# Patient Record
Sex: Female | Born: 1975 | Race: White | Hispanic: No | Marital: Married | Smoking: Never smoker
Health system: Southern US, Community
[De-identification: ages and names within clinical notes are randomized; demographics above are authoritative.]

## PROBLEM LIST (undated history)

## (undated) ENCOUNTER — Ambulatory Visit: Admission: EM | Disposition: A | Discharge status: Home / Self Care | Payer: BC Managed Care – PPO

## (undated) ENCOUNTER — Emergency Department (HOSPITAL_COMMUNITY): Payer: Self-pay | Source: Home / Self Care

## (undated) DIAGNOSIS — Z803 Family history of malignant neoplasm of breast: Secondary | ICD-10-CM

## (undated) DIAGNOSIS — K8020 Calculus of gallbladder without cholecystitis without obstruction: Secondary | ICD-10-CM

## (undated) DIAGNOSIS — R112 Nausea with vomiting, unspecified: Secondary | ICD-10-CM

## (undated) DIAGNOSIS — Z9889 Other specified postprocedural states: Secondary | ICD-10-CM

## (undated) DIAGNOSIS — K219 Gastro-esophageal reflux disease without esophagitis: Secondary | ICD-10-CM

## (undated) DIAGNOSIS — Z789 Other specified health status: Secondary | ICD-10-CM

## (undated) DIAGNOSIS — Z800 Family history of malignant neoplasm of digestive organs: Secondary | ICD-10-CM

## (undated) HISTORY — DX: Calculus of gallbladder without cholecystitis without obstruction: K80.20

## (undated) HISTORY — PX: BREAST LUMPECTOMY: SHX2

## (undated) HISTORY — DX: Family history of malignant neoplasm of digestive organs: Z80.0

## (undated) HISTORY — DX: Family history of malignant neoplasm of breast: Z80.3

## (undated) HISTORY — PX: DENTAL SURGERY: SHX609

## (undated) HISTORY — PX: DILATION AND CURETTAGE OF UTERUS: SHX78

---
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
## 2005-11-06 ENCOUNTER — Ambulatory Visit: Payer: Self-pay

## 2005-11-06 IMAGING — US ULTRASOUND LEFT BREAST
1 series · 8 of 8 positions shown · non-contrast
Comparison: none

REASON FOR EXAM: Palpable area at approximately 5 o'clock
COMMENTS:

PROCEDURE:     US  - US BREAST LEFT  - [DATE]  [DATE]
RESULT:     The LEFT breast was evaluated in the area of palpable
abnormality from the 3 o'clock to 6 o'clock position.
No sonographic abnormalities are identified.

[Series 1: ultrasound left breast · 8 of 8 slices shown]
[im 1/8]
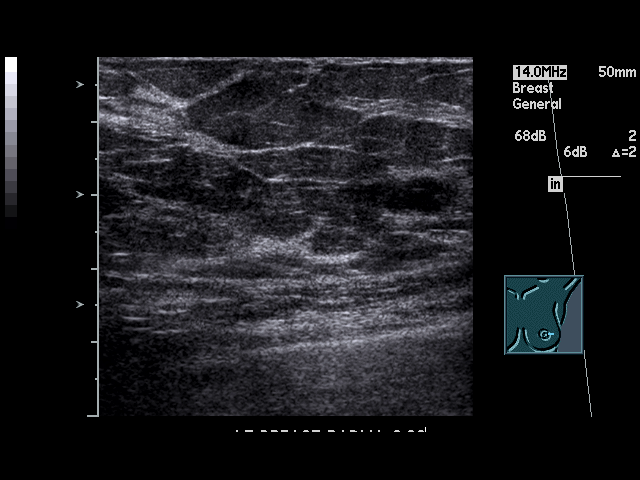
[im 2/8]
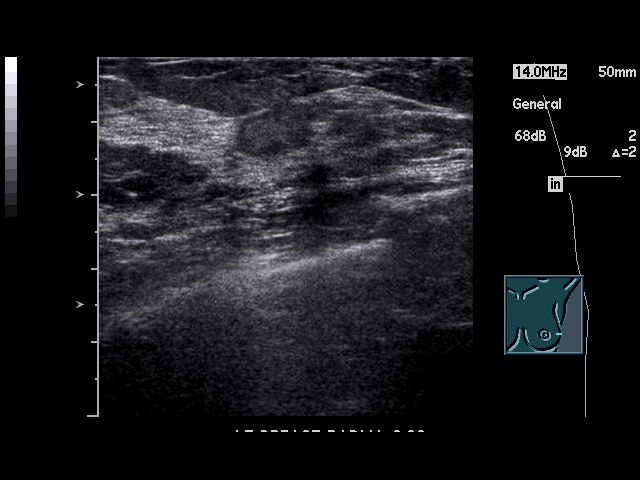
[im 3/8]
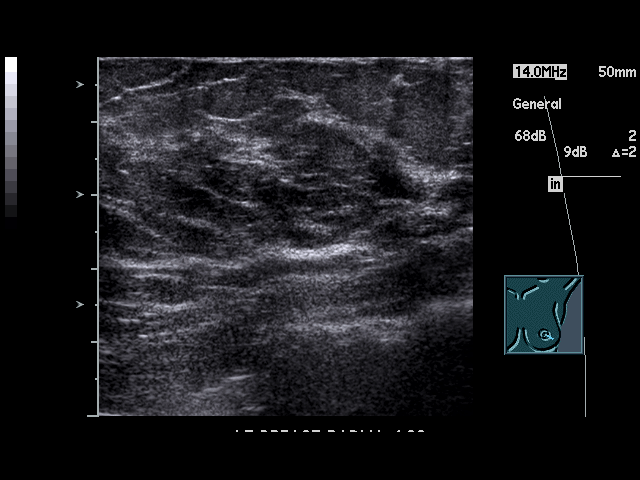
[im 4/8]
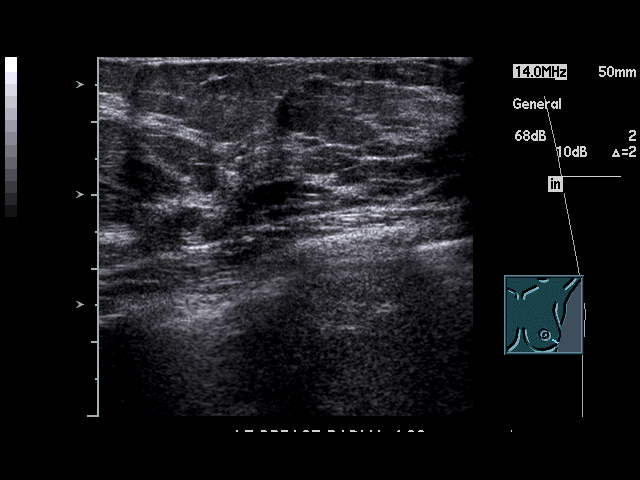
[im 5/8]
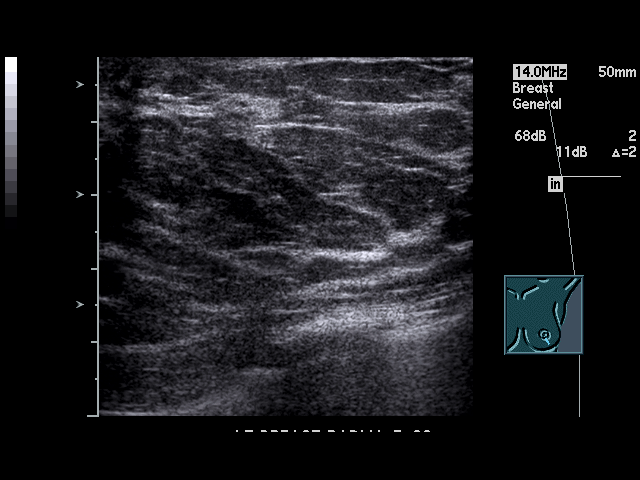
[im 6/8]
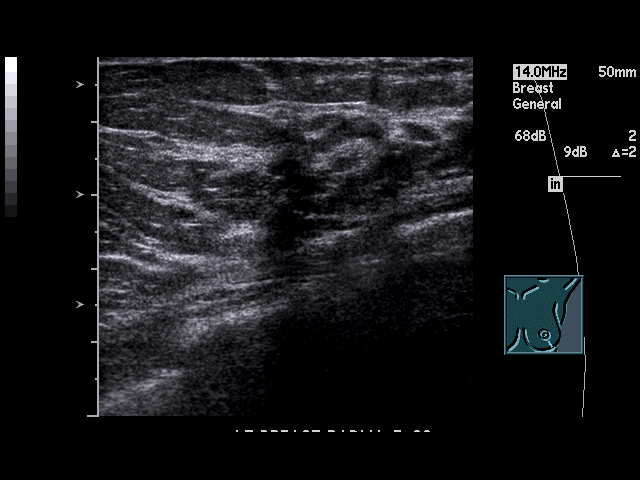
[im 7/8]
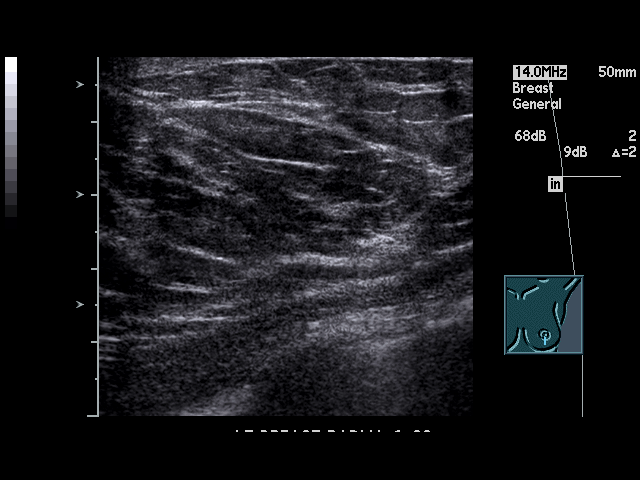
[im 8/8]
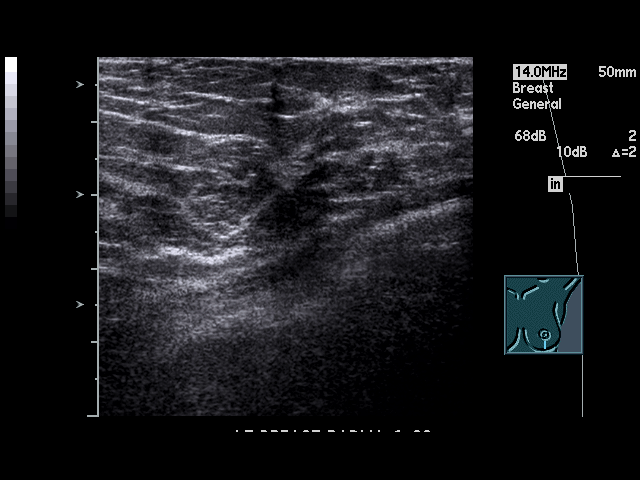

[8 of 8 positions shown; findings below may reference images not displayed]

IMPRESSION: Unremarkable focused LEFT Breast Ultrasound as described
above. Please refer to the radiographic view dictation for a completed
discussion.

## 2007-12-01 ENCOUNTER — Encounter: Admission: RE | Admit: 2007-12-01 | Discharge: 2007-12-01 | Payer: Self-pay | Admitting: Internal Medicine

## 2007-12-01 IMAGING — MG MM DIAGNOSTIC BILATERAL
5 series · 5 of 5 positions shown · non-contrast
Comparison: There are no prior studies currently available for comparison.

Addendum Begins
Addendum

available for comparison. When comparing the current study with the prior study there has been no 
significant change.  There is no specific evidence for malignancy within either breast.
CLINICAL DATA: The patient has a family history of breast cancer in a maternal grandmother in her
early 40s (approximately 43 or 44 years of age).  This cancer apparently recurred and the 
grandmother died of breast cancer.  She also has a history of maternal aunt with breast cancer in 
her early 50s.  Her mother died of pancreatic cancer in her early [[place]] there are multiple 
siblings on her mother's side with various cancers.  She states there is no known history of 
ovarian cancer.  The patient has undergone previous benign excisional biopsies of the left breast. 
She now states that she feels an area of palpable nodularity laterally within the left breast.

[L CC]
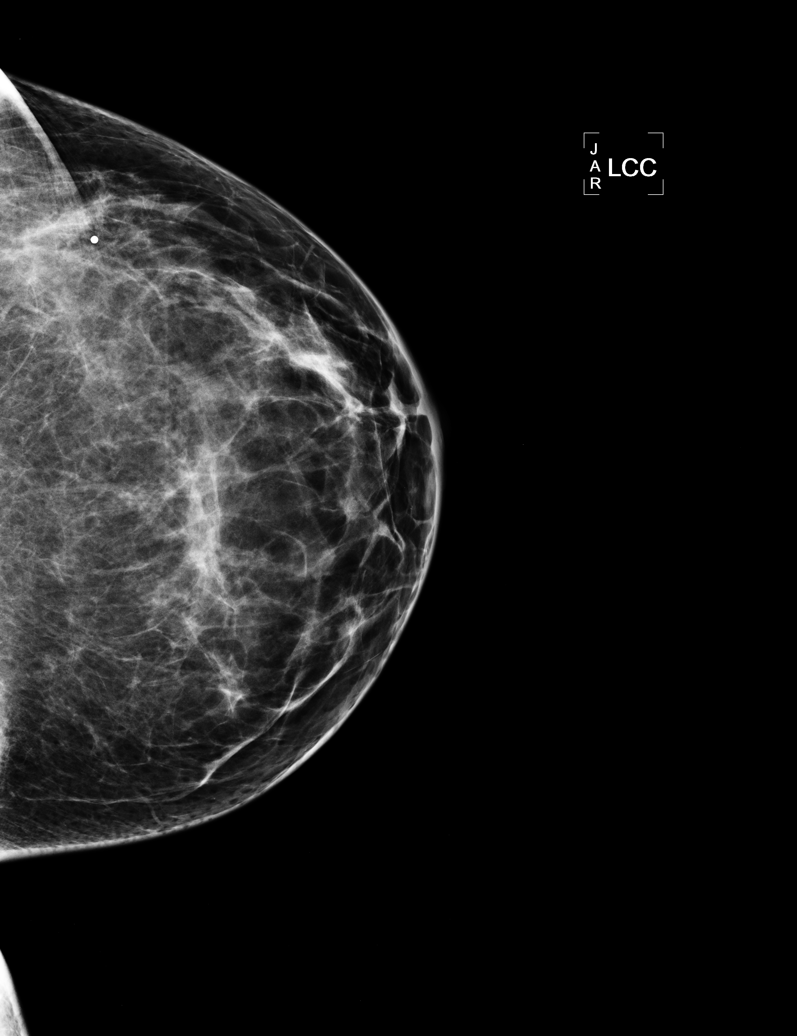

[L MLO]
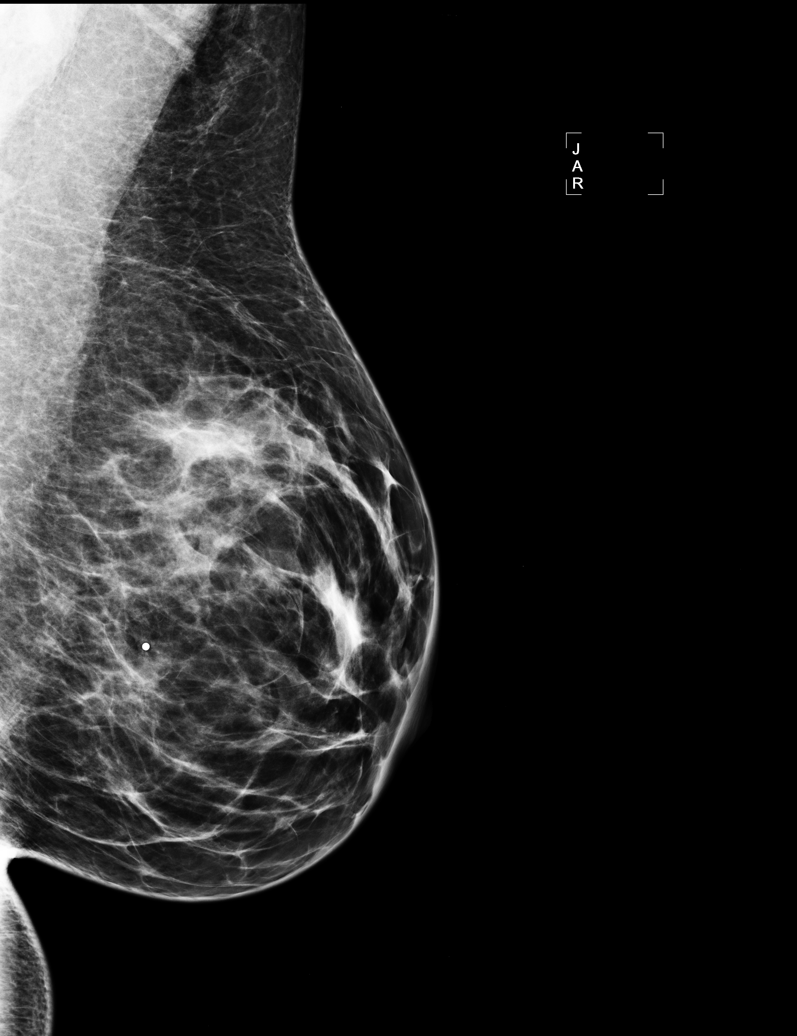

[R MLO (1 of 2)]
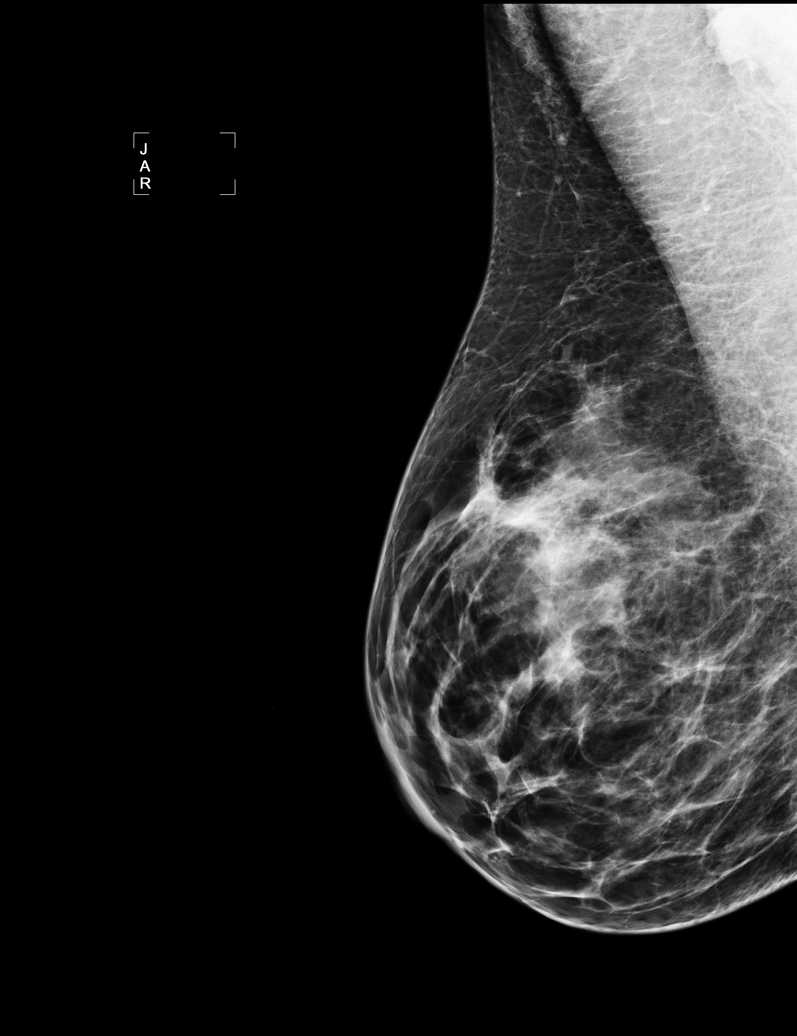

[R MLO (2 of 2)]
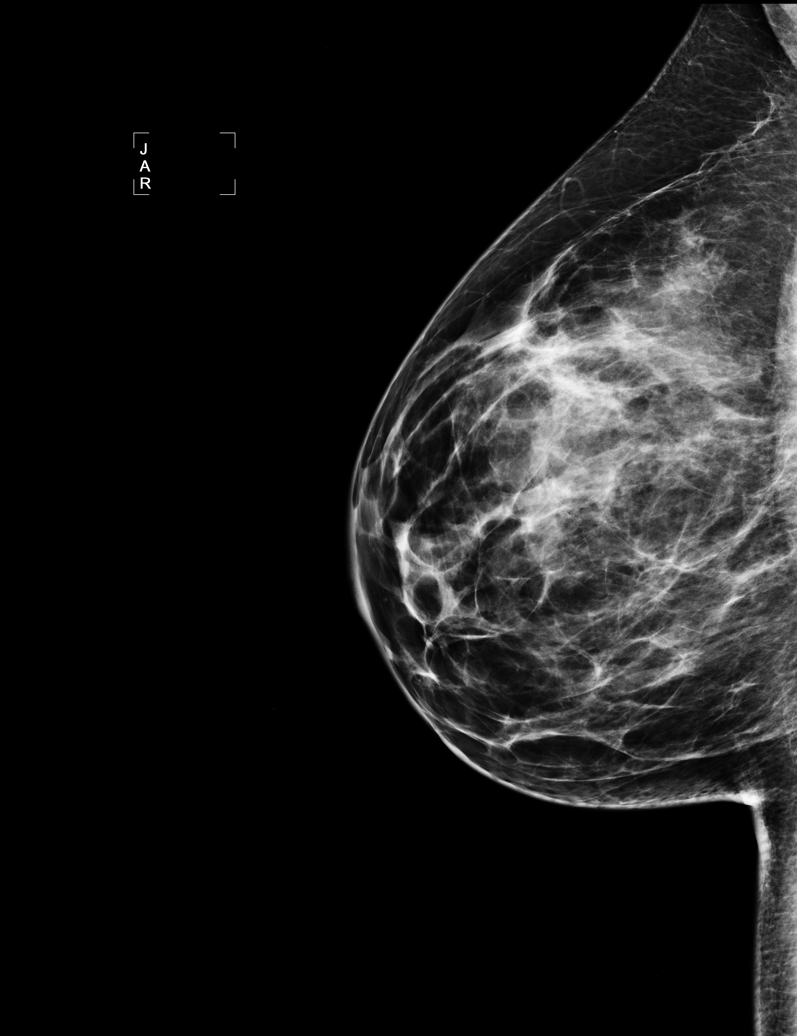

[L TAN]
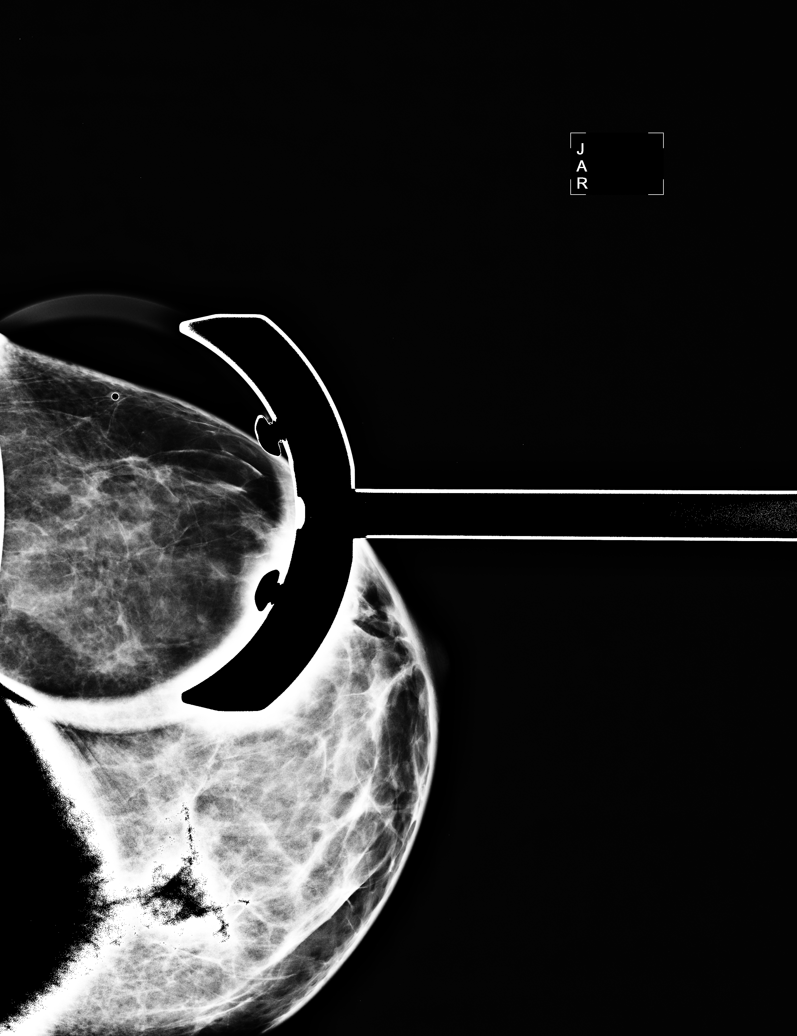

[5 of 5 positions shown; findings below may reference images not displayed]

IMPRESSION: Stable breast parenchymal pattern.  No specific evidence for malignancy.  Recommend initiation of 
yearly screening mammography at this time.  Also consider breast MRI at this time and possible 
genetic counseling as noted on the prior report.

ASSESSMENT:  Negative - BI-RADS 1.
ANALYZED BY COMPUTER AIDED DETECTION. ,
Addendum Ends
DG DIAGNOSTIC BILATERAL
Bilateral CC and MLO view(s) were taken.

LEFT BREAST ULTRASOUND

DIGITAL BILATERAL  DIAGNOSTIC MAMMOGRAM WITH CAD AND LEFT BREAST ULTRASOUND:
The patient states that
she has undergone previous mammography in [HOSPITAL][HOSPITAL], in [[place]].  Attempts will be made to 
obtain these prior studies.

There is a fibroglandular parenchymal pattern present.  There is no evidence for a mass and there 
are no suspicious microcalcifications.

On my directed physical examination of the lateral left breast there is no discrete palpable 
nodule.  Left breast ultrasound demonstrates no mass, cyst, distortion or abnormal shadowing.
IMPRESSION: No specific evidence for malignancy.  Given the patient's strong family history, I would consider 
consultation for possible genetic counseling and also consider breast MRI at this time.  Also I 
would initiate yearly screening mammography at this time.  The findings were discussed with the 
patient as well as the importance of breast self examination.  She is to contact her physician in 
the next several weeks for referral for possible genetic counseling and possible breast MRI.

ASSESSMENT: Negative - BI-RADS 1

Screening mammogram of both breasts in 1 year.
ANALYZED BY COMPUTER AIDED DETECTION. ,

## 2008-07-19 ENCOUNTER — Emergency Department: Payer: Self-pay | Admitting: Emergency Medicine

## 2008-07-19 IMAGING — CR RIGHT TIBIA AND FIBULA - 2 VIEW
1 series · 2 of 2 positions shown · non-contrast
Comparison: none

REASON FOR EXAM: r/o fx   PT IN [facility]
COMMENTS:

RESULT:     AP and lateral views of the RIGHT tibia and fibula reveal no
evidence of acute fracture. The observed portions of the knee and ankle are
normal in appearance. The overlying soft tissues also appear normal.

[Series 1: view not recorded · 0.17mm/px · 2 of 2 slices shown]
[im 1/2]
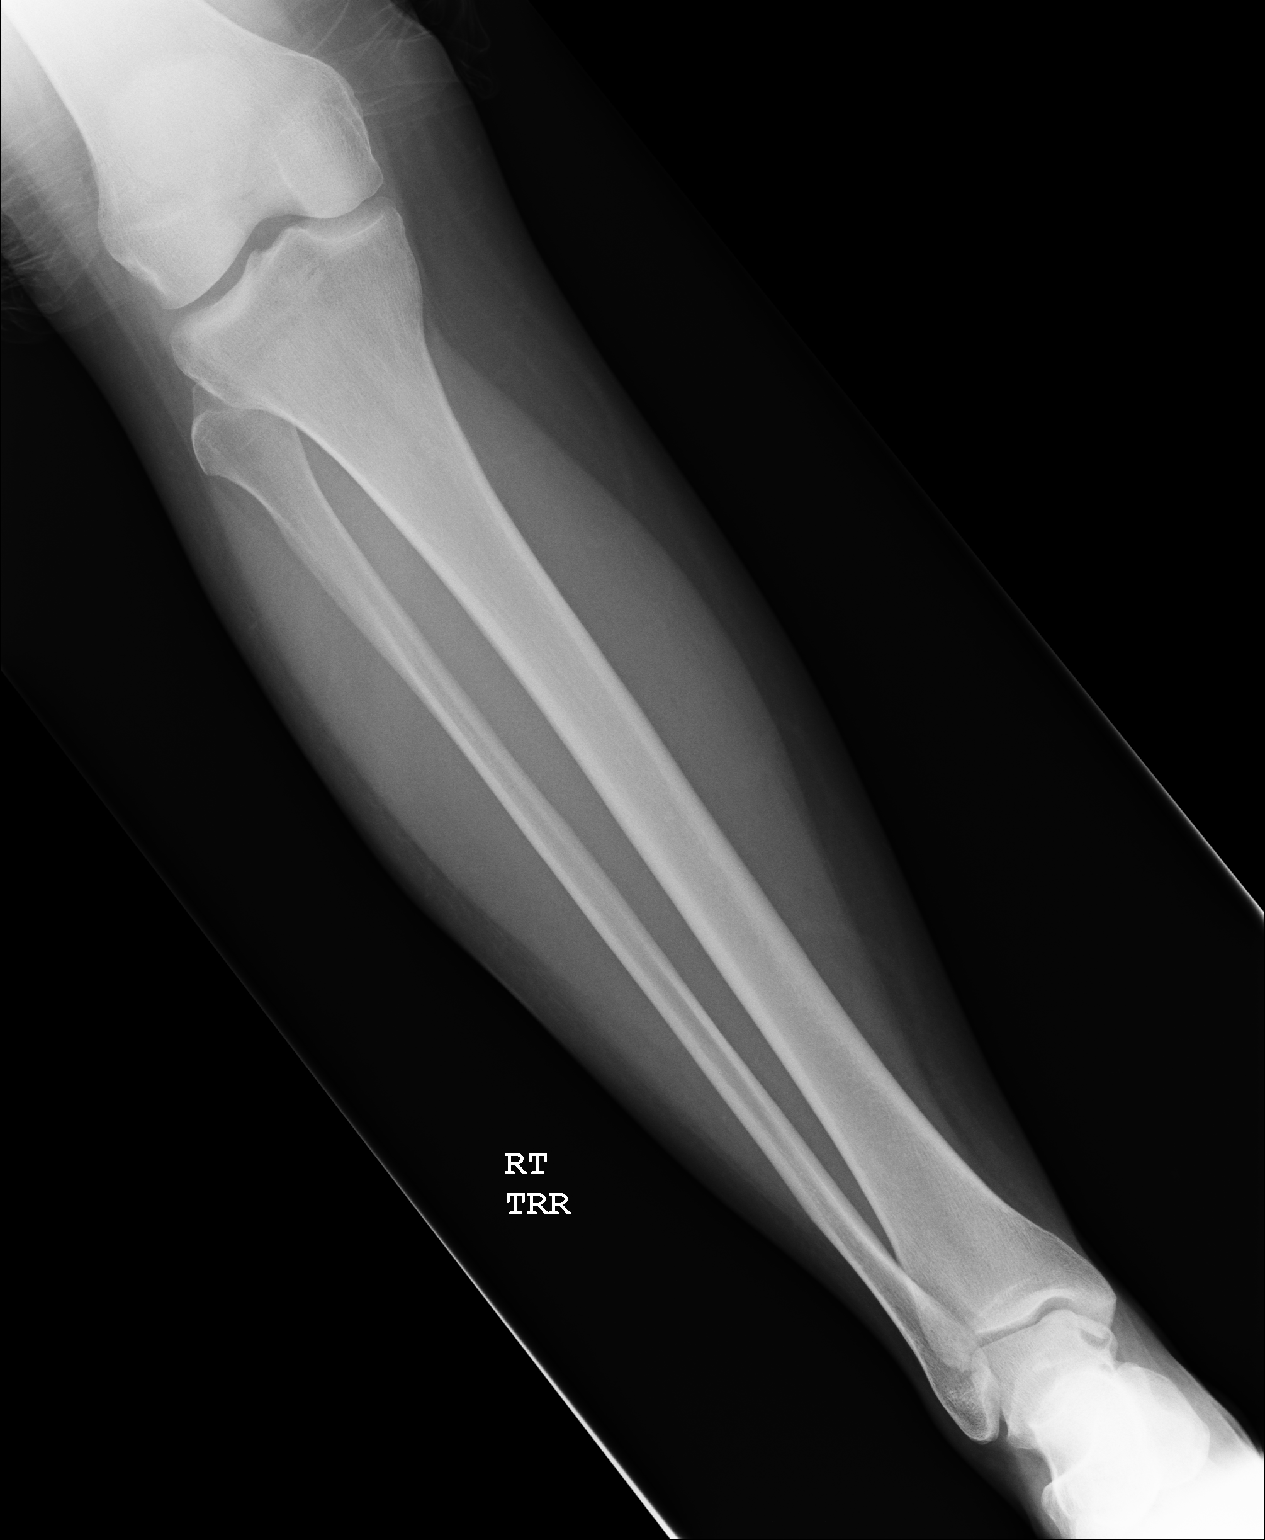
[im 2/2]
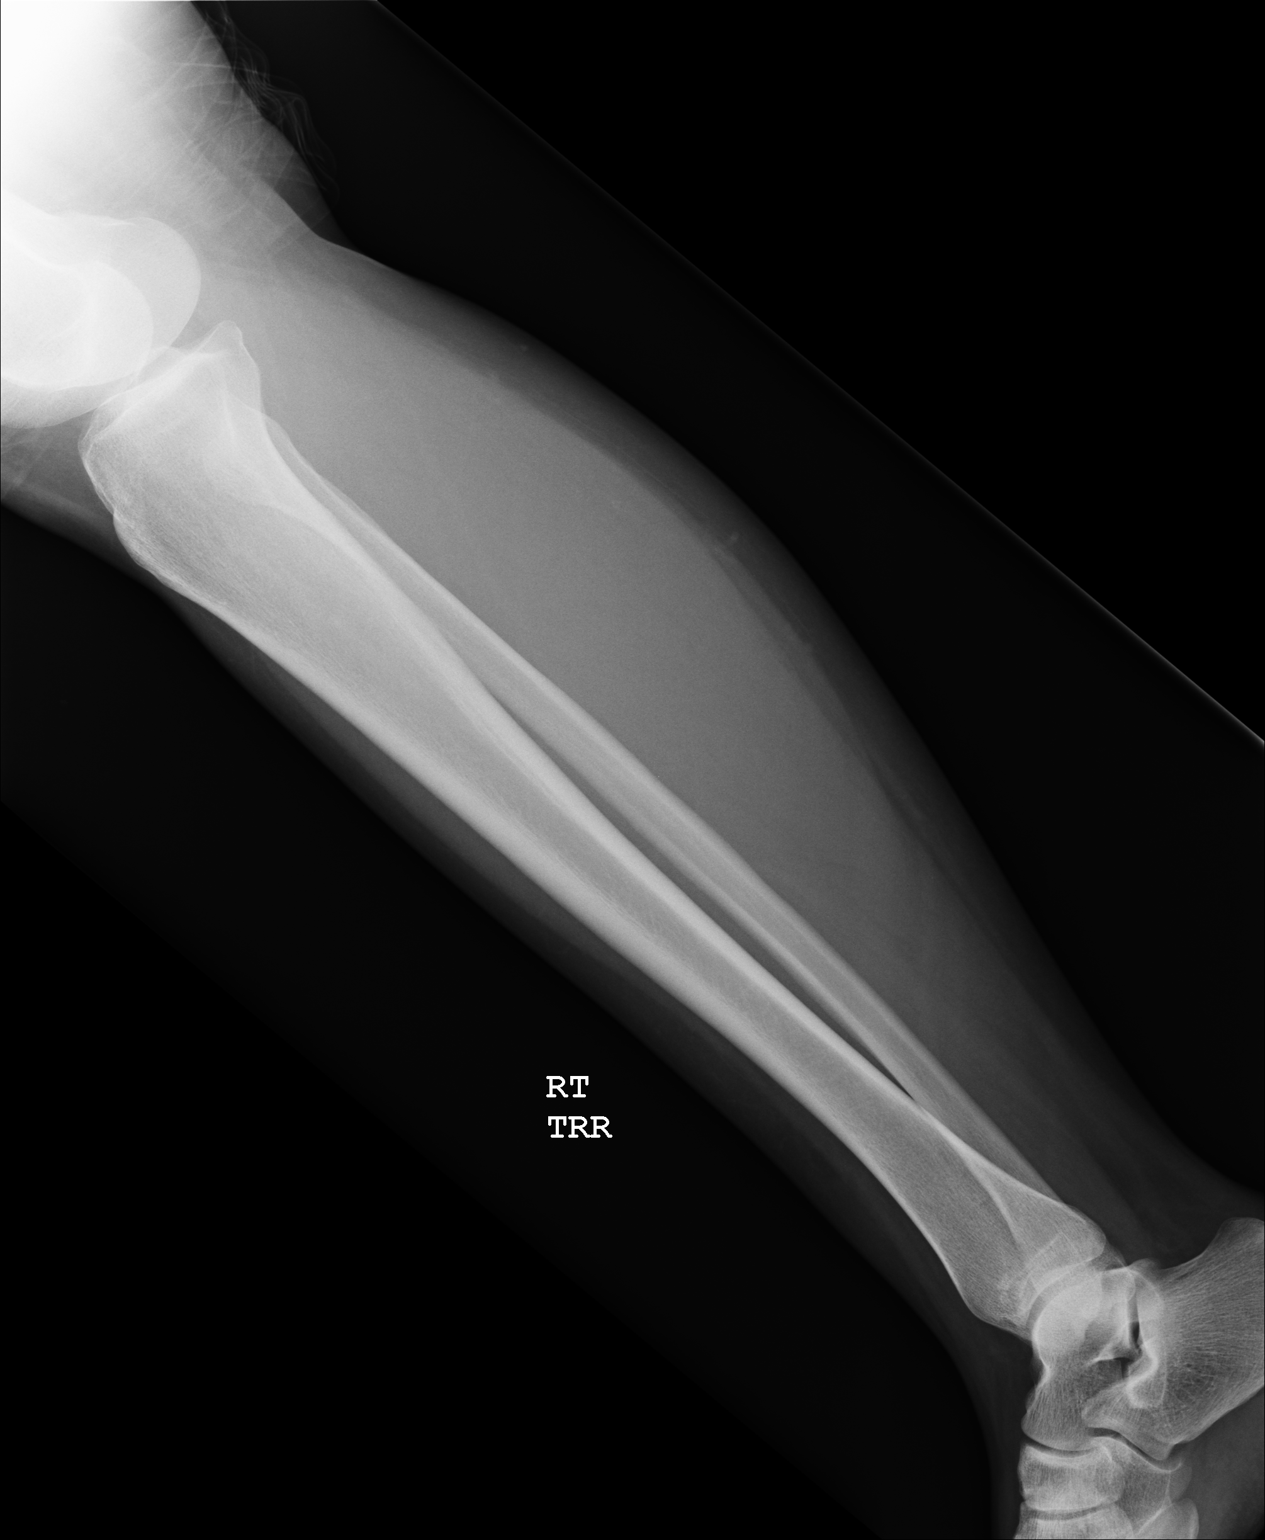

[2 of 2 positions shown; findings below may reference images not displayed]

IMPRESSION: I do not see evidence of acute fracture of the RIGHT tibia or fibula.
Followup imaging is available if the patient's symptoms persist.

## 2013-03-23 DIAGNOSIS — R5381 Other malaise: Secondary | ICD-10-CM | POA: Insufficient documentation

## 2013-03-23 DIAGNOSIS — M2550 Pain in unspecified joint: Secondary | ICD-10-CM | POA: Insufficient documentation

## 2014-02-22 DIAGNOSIS — R768 Other specified abnormal immunological findings in serum: Secondary | ICD-10-CM

## 2014-02-22 DIAGNOSIS — R7689 Other specified abnormal immunological findings in serum: Secondary | ICD-10-CM | POA: Insufficient documentation

## 2014-02-22 HISTORY — DX: Other specified abnormal immunological findings in serum: R76.8

## 2017-06-03 LAB — HIV ANTIBODY (ROUTINE TESTING W REFLEX): HIV 1&2 Ab, 4th Generation: NEGATIVE

## 2017-06-07 DIAGNOSIS — R7989 Other specified abnormal findings of blood chemistry: Secondary | ICD-10-CM | POA: Insufficient documentation

## 2017-06-07 HISTORY — DX: Other specified abnormal findings of blood chemistry: R79.89

## 2017-07-01 DIAGNOSIS — Z8759 Personal history of other complications of pregnancy, childbirth and the puerperium: Secondary | ICD-10-CM | POA: Insufficient documentation

## 2017-07-01 HISTORY — DX: Personal history of other complications of pregnancy, childbirth and the puerperium: Z87.59

## 2017-10-07 DIAGNOSIS — E079 Disorder of thyroid, unspecified: Secondary | ICD-10-CM

## 2017-10-07 HISTORY — DX: Disorder of thyroid, unspecified: E07.9

## 2019-07-23 DIAGNOSIS — K219 Gastro-esophageal reflux disease without esophagitis: Secondary | ICD-10-CM | POA: Diagnosis not present

## 2019-08-18 ENCOUNTER — Other Ambulatory Visit: Payer: Self-pay

## 2019-08-18 DIAGNOSIS — Z20822 Contact with and (suspected) exposure to covid-19: Secondary | ICD-10-CM

## 2019-08-19 ENCOUNTER — Ambulatory Visit: Payer: Self-pay

## 2019-08-19 LAB — NOVEL CORONAVIRUS, NAA: SARS-CoV-2, NAA: NOT DETECTED

## 2019-09-01 ENCOUNTER — Ambulatory Visit: Payer: Self-pay

## 2019-09-01 ENCOUNTER — Other Ambulatory Visit: Payer: Self-pay

## 2019-09-01 DIAGNOSIS — Z23 Encounter for immunization: Secondary | ICD-10-CM

## 2019-09-02 DIAGNOSIS — M545 Low back pain: Secondary | ICD-10-CM | POA: Diagnosis not present

## 2019-09-02 DIAGNOSIS — M9901 Segmental and somatic dysfunction of cervical region: Secondary | ICD-10-CM | POA: Diagnosis not present

## 2019-09-02 DIAGNOSIS — M542 Cervicalgia: Secondary | ICD-10-CM | POA: Diagnosis not present

## 2019-09-02 DIAGNOSIS — M9903 Segmental and somatic dysfunction of lumbar region: Secondary | ICD-10-CM | POA: Diagnosis not present

## 2019-09-03 ENCOUNTER — Ambulatory Visit (INDEPENDENT_AMBULATORY_CARE_PROVIDER_SITE_OTHER): Payer: BC Managed Care – PPO | Admitting: Obstetrics and Gynecology

## 2019-09-03 ENCOUNTER — Encounter: Payer: Self-pay | Admitting: Obstetrics and Gynecology

## 2019-09-03 ENCOUNTER — Other Ambulatory Visit (HOSPITAL_COMMUNITY)
Admission: RE | Admit: 2019-09-03 | Discharge: 2019-09-03 | Disposition: A | Discharge status: Home / Self Care | Payer: BC Managed Care – PPO | Source: Ambulatory Visit | Attending: Obstetrics and Gynecology | Admitting: Obstetrics and Gynecology

## 2019-09-03 ENCOUNTER — Other Ambulatory Visit: Payer: Self-pay

## 2019-09-03 VITALS — BP 133/80 | Ht 63.0 in | Wt 202.0 lb

## 2019-09-03 DIAGNOSIS — Z01419 Encounter for gynecological examination (general) (routine) without abnormal findings: Secondary | ICD-10-CM | POA: Diagnosis not present

## 2019-09-03 DIAGNOSIS — Z1272 Encounter for screening for malignant neoplasm of vagina: Secondary | ICD-10-CM | POA: Diagnosis not present

## 2019-09-03 DIAGNOSIS — Z1339 Encounter for screening examination for other mental health and behavioral disorders: Secondary | ICD-10-CM

## 2019-09-03 DIAGNOSIS — Z8759 Personal history of other complications of pregnancy, childbirth and the puerperium: Secondary | ICD-10-CM

## 2019-09-03 DIAGNOSIS — Z1329 Encounter for screening for other suspected endocrine disorder: Secondary | ICD-10-CM

## 2019-09-03 DIAGNOSIS — Z1331 Encounter for screening for depression: Secondary | ICD-10-CM

## 2019-09-03 DIAGNOSIS — Z124 Encounter for screening for malignant neoplasm of cervix: Secondary | ICD-10-CM

## 2019-09-03 NOTE — Progress Notes (Signed)
scGynecology Annual Exam  PCP: Patient, No Pcp Per  Chief Complaint  Patient presents with  . Gynecologic Exam    had miscarriage march 2020 and has a couple of qs regarding it   History of Present Illness:  Ms. Kayla West is a 43 y.o. [id number] who LMP was Patient's last menstrual period was 08/21/2019 (approximate)., presents today for her annual examination.  Her menses are regular every 28-30 days, lasting 5 day(s).  Dysmenorrhea mild, occurring first 1-2 days of flow. She does not have intermenstrual bleeding.  She is sexually active.  She uses condoms for contraception.  Last Pap: 12/2015  Results were: no abnormalities /neg HPV DNA negative Hx of STDs: none  There is FH of breast cancer in maternal aunt twice and her maternal grandmother. She does no believe her aunt was tested for genetic mutations. There is no FH of ovarian cancer. Her mother had pancreatic cancer.  The patient does do self-breast exams.  Her last mammogram was a while ago.   Tobacco use: The patient denies current or previous tobacco use. Alcohol use: social drinker Exercise: not active ("not as much as I should.")  The patient wears seatbelts: yes.   The patient reports that domestic violence in her life is absent.   Patient had a miscarriage in March.  This was the only miscarriage she has ever had.  This past month she did have some spotting that was brown.  She would like a pelvic ultrasound to make sure everything is OK.  She had the return of regular periods.  She delivered twins in 11/2017.    With her miscarriage she had a spontaneous passage of the pregnancy. She was told she was about [redacted] week along.    Past Medical History:  Diagnosis Date  . No pertinent past medical history     Past Surgical History:  Procedure Laterality Date  . BREAST LUMPECTOMY     benign indication  . CESAREAN SECTION     2019, 2015    Prior to Admission medications   denies   Allergies:  No Known  Allergies   Obstetric History: [id number], s/p c-setion with G1, and repeat with G2 (twins).  G3 was an SAB.  Social History   Socioeconomic History  . Marital status: Married    Spouse name: Not on file  . Number of children: Not on file  . Years of education: Not on file  . Highest education level: Not on file  Occupational History  . Not on file  Social Needs  . Financial resource strain: Not on file  . Food insecurity    Worry: Not on file    Inability: Not on file  . Transportation needs    Medical: Not on file    Non-medical: Not on file  Tobacco Use  . Smoking status: Never Smoker  . Smokeless tobacco: Never Used  Substance and Sexual Activity  . Alcohol use: Yes  . Drug use: Never  . Sexual activity: Yes    Birth control/protection: None, Condom  Lifestyle  . Physical activity    Days per week: Not on file    Minutes per session: Not on file  . Stress: Not on file  Relationships  . Social Herbalist on phone: Not on file    Gets together: Not on file    Attends religious service: Not on file    Active member of club or organization: Not on file  Attends meetings of clubs or organizations: Not on file    Relationship status: Not on file  . Intimate partner violence    Fear of current or ex partner: Not on file    Emotionally abused: Not on file    Physically abused: Not on file    Forced sexual activity: Not on file  Other Topics Concern  . Not on file  Social History Narrative  . Not on file    Family History  Problem Relation Age of Onset  . Pancreatic cancer Mother 1855  . Breast cancer Maternal Aunt        twice-50s and 60s  . Breast cancer Maternal Grandmother        twice- 60s and 70s    Review of Systems  Constitutional: Negative.   HENT: Negative.   Eyes: Negative.   Respiratory: Negative.   Cardiovascular: Negative.   Gastrointestinal: Negative.   Genitourinary: Negative.   Musculoskeletal: Negative.   Skin: Negative.    Neurological: Negative.   Psychiatric/Behavioral: Negative.      Physical Exam BP 133/80   Ht 5\' 3"  (1.6 m)   Wt 202 lb (91.6 kg)   LMP 08/21/2019 (Approximate)   BMI 35.78 kg/m    Physical Exam Constitutional:      General: She is not in acute distress.    Appearance: Normal appearance. She is well-developed.  Genitourinary:     Pelvic exam was performed with patient supine.     Vulva, urethra, bladder and uterus normal.     No inguinal adenopathy present in the right or left side.    No signs of injury in the vagina.     No vaginal discharge, erythema, tenderness or bleeding.     No cervical motion tenderness, discharge, lesion or polyp.     Uterus is mobile.     Uterus is not enlarged or tender.     No uterine mass detected.    Uterus is anteverted.     No right or left adnexal mass present.     Right adnexa not tender or full.     Left adnexa not tender or full.  HENT:     Head: Normocephalic and atraumatic.  Eyes:     General: No scleral icterus.    Conjunctiva/sclera: Conjunctivae normal.  Neck:     Musculoskeletal: Normal range of motion and neck supple.     Thyroid: No thyromegaly.  Cardiovascular:     Rate and Rhythm: Normal rate and regular rhythm.     Heart sounds: No murmur. No friction rub. No gallop.   Pulmonary:     Effort: Pulmonary effort is normal. No respiratory distress.     Breath sounds: Normal breath sounds. No wheezing or rales.  Chest:     Breasts:        Right: No inverted nipple, mass, nipple discharge, skin change or tenderness.        Left: No inverted nipple, mass, nipple discharge, skin change or tenderness.  Abdominal:     General: Bowel sounds are normal. There is no distension.     Palpations: Abdomen is soft. There is no mass.     Tenderness: There is no abdominal tenderness. There is no guarding or rebound.  Musculoskeletal: Normal range of motion.        General: No swelling or tenderness.  Lymphadenopathy:     Cervical: No  cervical adenopathy.     Lower Body: No right inguinal adenopathy. No left inguinal adenopathy.  Neurological:     General: No focal deficit present.     Mental Status: She is alert and oriented to person, place, and time.     Cranial Nerves: No cranial nerve deficit.  Skin:    General: Skin is warm and dry.     Findings: No erythema or rash.  Psychiatric:        Mood and Affect: Mood normal.        Behavior: Behavior normal.        Judgment: Judgment normal.     Female chaperone present for pelvic and breast  portions of the physical exam  Results: AUDIT Questionnaire (screen for alcoholism): 2 PHQ-9: 2   Assessment: 43 y.o. G6P2013 female here for routine annual gynecologic examination  Plan: Problem List Items Addressed This Visit    None    Visit Diagnoses    Women's annual routine gynecological examination    -  Primary   Relevant Orders   US PELVIS TRANSVAGINAL NON-OB (TV ONLY)   Cytology - PAP   TSH + free T4   Screening for depression       Screening for alcoholism       Pap smear for cervical cancer screening       Relevant Orders   Cytology - PAP   History of miscarriage       Relevant Orders   US PELVIS TRANSVAGINAL NON-OB (TV ONLY)   Screening for thyroid disorder       Relevant Orders   TSH + free T4      Screening: -- Blood pressure screen normal  -- Mammogram: patient to call [facility] to schedule. -- Weight screening: obese: discussed management options, including lifestyle, dietary, and exercise. -- Depression screening negative (PHQ-9) -- Nutrition: normal -- cholesterol screening: not due for screening -- osteoporosis screening: not due -- tobacco screening: not using -- alcohol screening: AUDIT questionnaire indicates low-risk usage. -- family history of breast cancer screening: done. not at high risk. -- no evidence of domestic violence or intimate partner violence. -- STD screening: gonorrhea/chlamydia NAAT not collected per patient  request. -- pap smear collected per ASCCP guidelines -- flu vaccine received last week  Pelvic ultrasound to assess resolution of miscarriage.   Thomasene Mohair, MD 09/03/2019 1:59 PM

## 2019-09-04 LAB — TSH+FREE T4
Free T4: 1.13 ng/dL (ref 0.82–1.77)
TSH: 2.44 u[IU]/mL (ref 0.450–4.500)

## 2019-09-09 DIAGNOSIS — M5387 Other specified dorsopathies, lumbosacral region: Secondary | ICD-10-CM | POA: Diagnosis not present

## 2019-09-09 DIAGNOSIS — M9902 Segmental and somatic dysfunction of thoracic region: Secondary | ICD-10-CM | POA: Diagnosis not present

## 2019-09-09 DIAGNOSIS — M531 Cervicobrachial syndrome: Secondary | ICD-10-CM | POA: Diagnosis not present

## 2019-09-10 LAB — CYTOLOGY - PAP
Comment: NEGATIVE
Diagnosis: NEGATIVE
High risk HPV: NEGATIVE

## 2019-09-15 ENCOUNTER — Other Ambulatory Visit: Payer: Self-pay | Admitting: Obstetrics and Gynecology

## 2019-09-15 DIAGNOSIS — Z1231 Encounter for screening mammogram for malignant neoplasm of breast: Secondary | ICD-10-CM

## 2019-09-16 DIAGNOSIS — M5387 Other specified dorsopathies, lumbosacral region: Secondary | ICD-10-CM | POA: Diagnosis not present

## 2019-09-16 DIAGNOSIS — M9902 Segmental and somatic dysfunction of thoracic region: Secondary | ICD-10-CM | POA: Diagnosis not present

## 2019-09-16 DIAGNOSIS — M531 Cervicobrachial syndrome: Secondary | ICD-10-CM | POA: Diagnosis not present

## 2019-09-18 ENCOUNTER — Other Ambulatory Visit: Payer: Self-pay | Admitting: Obstetrics and Gynecology

## 2019-09-18 DIAGNOSIS — Z1231 Encounter for screening mammogram for malignant neoplasm of breast: Secondary | ICD-10-CM

## 2019-09-21 ENCOUNTER — Other Ambulatory Visit: Payer: BC Managed Care – PPO

## 2019-09-21 ENCOUNTER — Ambulatory Visit: Payer: BC Managed Care – PPO | Admitting: Obstetrics and Gynecology

## 2019-09-23 DIAGNOSIS — M531 Cervicobrachial syndrome: Secondary | ICD-10-CM | POA: Diagnosis not present

## 2019-09-23 DIAGNOSIS — M9902 Segmental and somatic dysfunction of thoracic region: Secondary | ICD-10-CM | POA: Diagnosis not present

## 2019-10-01 ENCOUNTER — Ambulatory Visit
Admission: RE | Admit: 2019-10-01 | Discharge: 2019-10-01 | Disposition: A | Discharge status: Home / Self Care | Payer: BC Managed Care – PPO | Source: Ambulatory Visit | Attending: Obstetrics and Gynecology | Admitting: Obstetrics and Gynecology

## 2019-10-01 ENCOUNTER — Other Ambulatory Visit: Payer: Self-pay

## 2019-10-01 ENCOUNTER — Telehealth: Payer: Self-pay | Admitting: Obstetrics and Gynecology

## 2019-10-01 DIAGNOSIS — Z1231 Encounter for screening mammogram for malignant neoplasm of breast: Secondary | ICD-10-CM

## 2019-10-01 IMAGING — MG DIGITAL SCREENING BILAT W/ TOMO W/ CAD
8 series · 8 of 24 positions shown · non-contrast
Comparison: Previous exam(s).

CLINICAL DATA: Screening.

EXAM:
DIGITAL SCREENING BILATERAL MAMMOGRAM WITH TOMO AND CAD

[L CC synth-2D]
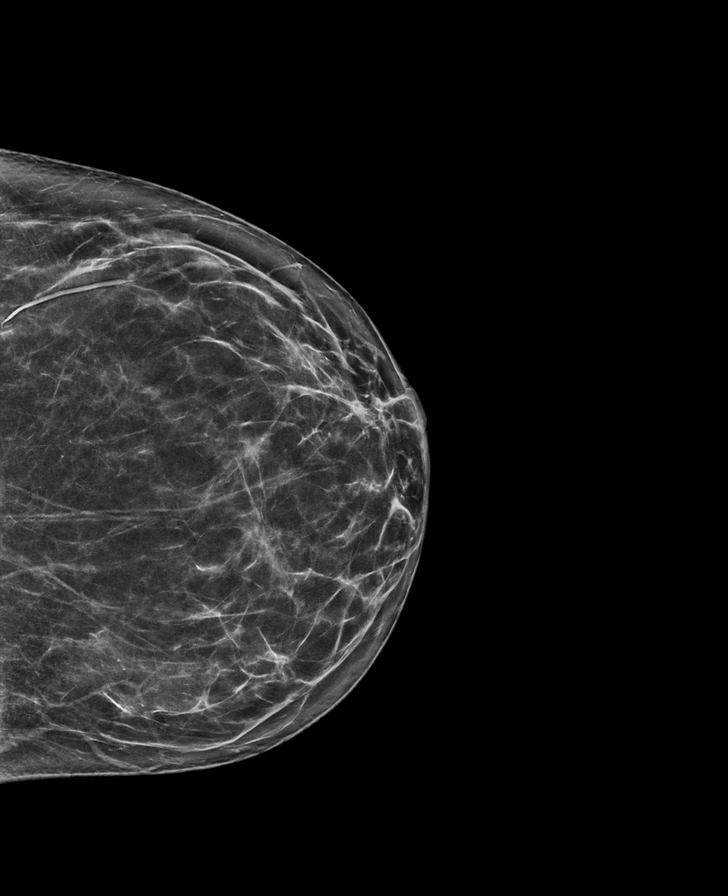

[L MLO synth-2D]
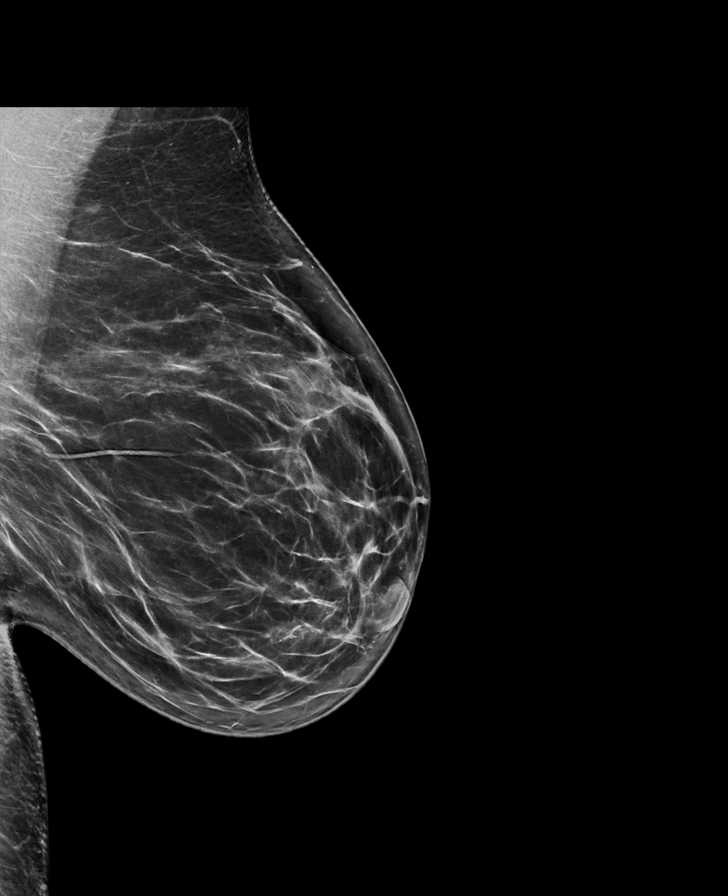

[R CC synth-2D]
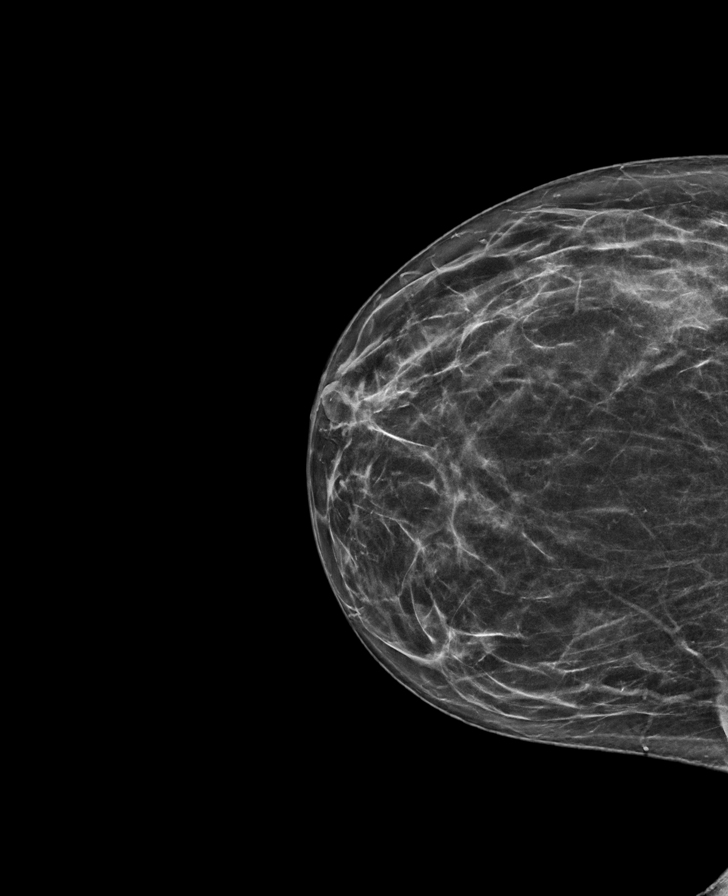

[R MLO synth-2D]
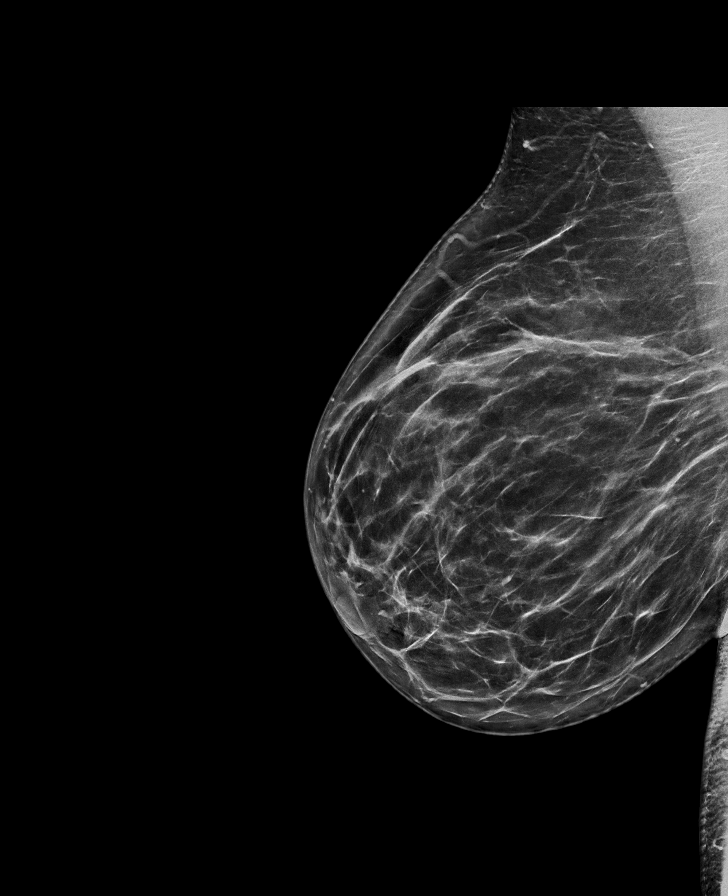

[L MLO tomo · tomo slice 43/86.0]
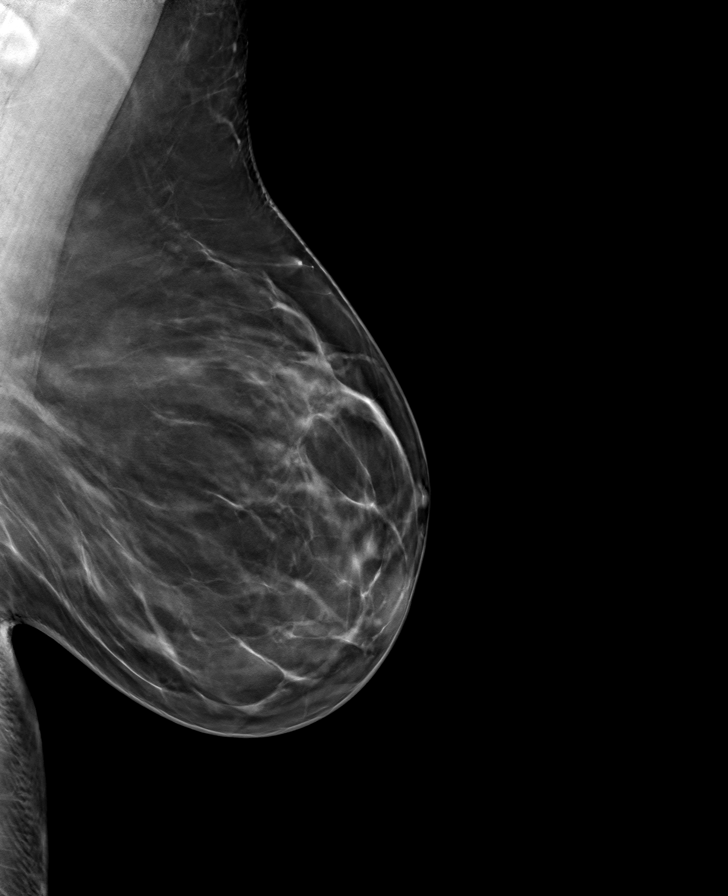

[R CC tomo · tomo slice 33/65.0]
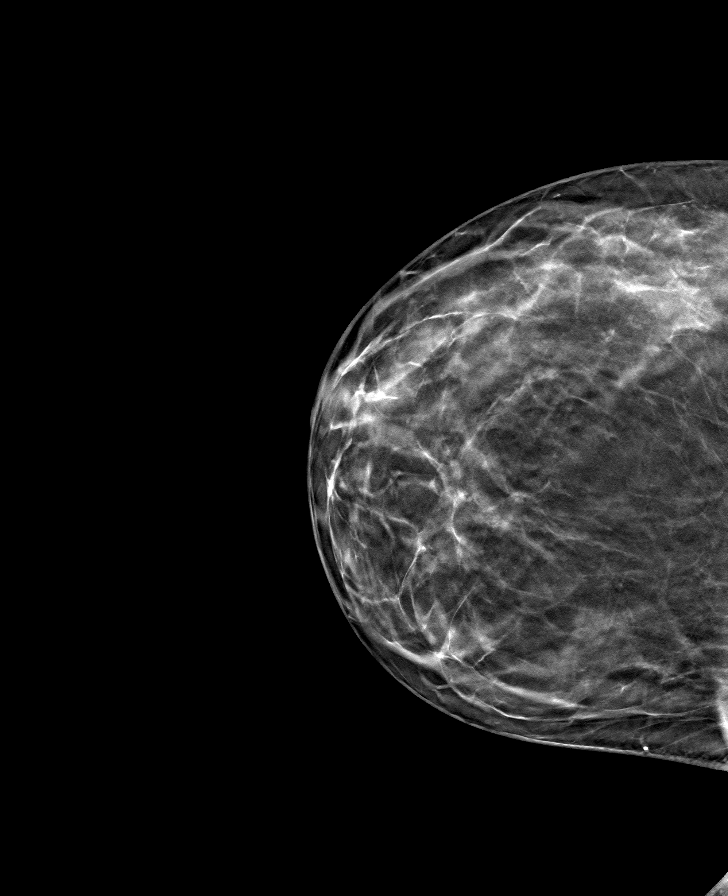

[L CC tomo · tomo slice 35/70.0]
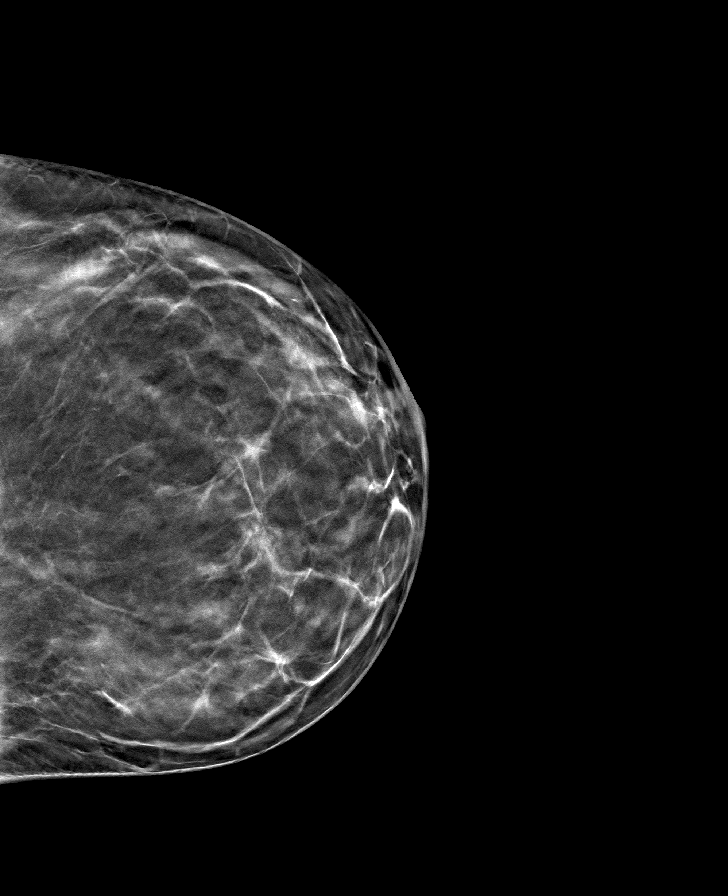

[R MLO tomo · tomo slice 41/82.0]
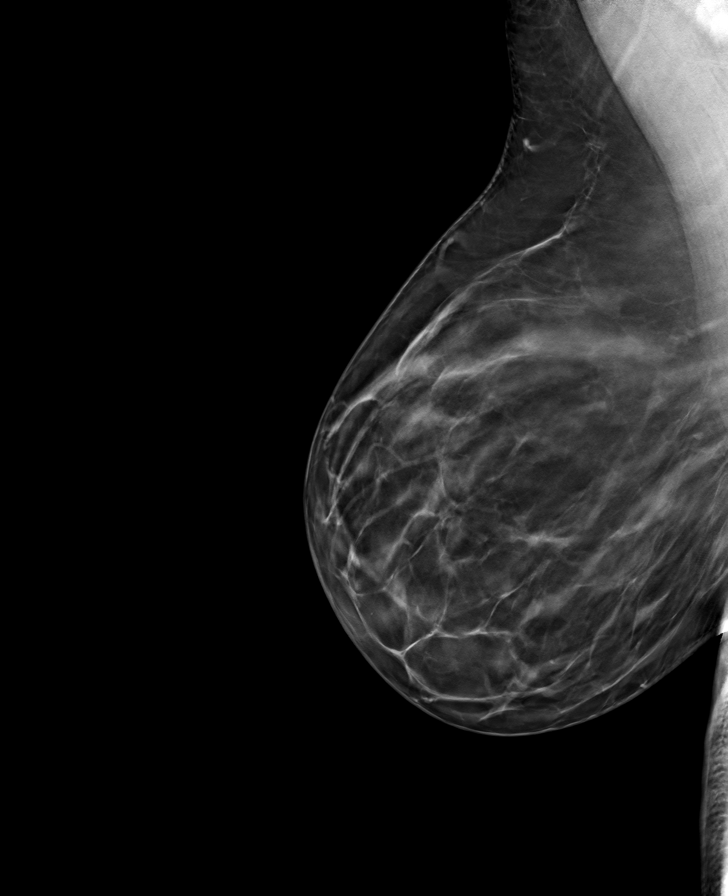

[8 of 24 positions shown; findings below may reference images not displayed]

ACR Breast Density Category b: There are scattered areas of
fibroglandular density.
FINDINGS: There are no findings suspicious for malignancy. Images were
processed with CAD.
IMPRESSION: No mammographic evidence of malignancy. A result letter of this
screening mammogram will be mailed directly to the patient.

RECOMMENDATION:
Screening mammogram in one year. (Code:[TQ])

BI-RADS CATEGORY  1: Negative.

## 2019-10-01 NOTE — Telephone Encounter (Signed)
Patient calling asking about turnaround time for screening mammogram.  Please advise.

## 2019-10-02 NOTE — Telephone Encounter (Signed)
Patient aware that Mammogram results aren't back yet and that Dr Glennon Mac will call her once the results have come back. Patient will send Dr Lydia Guiles message regarding the results.

## 2019-10-02 NOTE — Telephone Encounter (Signed)
It looks like the exam ended late yesterday afternoon.  The results have not been posted yet.  The turnaround time is usually pretty fast (same day typically).  I would imagine the results would be back today.  If she sends me a message I can follow up later today or if I receive a message with the results, I can release them out to her.

## 2019-10-07 ENCOUNTER — Ambulatory Visit (INDEPENDENT_AMBULATORY_CARE_PROVIDER_SITE_OTHER): Payer: BC Managed Care – PPO

## 2019-10-07 ENCOUNTER — Ambulatory Visit (INDEPENDENT_AMBULATORY_CARE_PROVIDER_SITE_OTHER): Payer: BC Managed Care – PPO | Admitting: Obstetrics and Gynecology

## 2019-10-07 ENCOUNTER — Encounter: Payer: Self-pay | Admitting: Obstetrics and Gynecology

## 2019-10-07 ENCOUNTER — Other Ambulatory Visit: Payer: Self-pay

## 2019-10-07 VITALS — BP 124/80 | Ht 62.0 in | Wt 199.0 lb

## 2019-10-07 DIAGNOSIS — Z30011 Encounter for initial prescription of contraceptive pills: Secondary | ICD-10-CM | POA: Diagnosis not present

## 2019-10-07 DIAGNOSIS — Z8759 Personal history of other complications of pregnancy, childbirth and the puerperium: Secondary | ICD-10-CM | POA: Diagnosis not present

## 2019-10-07 DIAGNOSIS — R198 Other specified symptoms and signs involving the digestive system and abdomen: Secondary | ICD-10-CM

## 2019-10-07 DIAGNOSIS — Z01419 Encounter for gynecological examination (general) (routine) without abnormal findings: Secondary | ICD-10-CM

## 2019-10-07 DIAGNOSIS — R9389 Abnormal findings on diagnostic imaging of other specified body structures: Secondary | ICD-10-CM

## 2019-10-07 DIAGNOSIS — N393 Stress incontinence (female) (male): Secondary | ICD-10-CM

## 2019-10-07 DIAGNOSIS — M9902 Segmental and somatic dysfunction of thoracic region: Secondary | ICD-10-CM | POA: Diagnosis not present

## 2019-10-07 DIAGNOSIS — M531 Cervicobrachial syndrome: Secondary | ICD-10-CM | POA: Diagnosis not present

## 2019-10-07 MED ORDER — NORGESTIMATE-ETH ESTRADIOL 0.25-35 MG-MCG PO TABS: 1.0000 | ORAL_TABLET | Freq: Every day | ORAL | 4 refills | Status: DC

## 2019-10-07 NOTE — Progress Notes (Signed)
Gynecology Ultrasound Follow Up   Chief Complaint  Patient presents with   Follow-up  U/S. Miscarriage this past spring   History of Present Illness: Patient is a 43 y.o. female who presents today for ultrasound evaluation of the above.  Ultrasound demonstrates the following findings Adnexa: no masses seen  Uterus: anteverted with endometrial stripe  15.9 mm with small cystic areas of uncertain clinical significance Additional: none  She has had a regular period since her last visit.   Patient has a "bubbling" sensation in her abdominal area frequently. She has been told that she should be evaluated by GI. She requests a referral.  She is currently on no method of contraception and desiring to start OCP (estrogen/progesterone).  She has a past medical history significant for no contraindication to estrogen.  She specifically denies a history of migraine with aura, chronic hypertension, history of DVT/PE and smoking.  Reported Patient's last menstrual period was 09/14/2019 (exact date)..      Past Medical History:  Diagnosis Date   No pertinent past medical history     Past Surgical History:  Procedure Laterality Date   BREAST LUMPECTOMY Left    benign indication   CESAREAN SECTION     2019, 2015    Family History  Problem Relation Age of Onset   Pancreatic cancer Mother 43   Breast cancer Maternal Aunt        twice-50s and 32s   Breast cancer Maternal Grandmother        twice- 25s and 17s    Social History   Socioeconomic History   Marital status: Married    Spouse name: Not on file   Number of children: Not on file   Years of education: Not on file   Highest education level: Not on file  Occupational History   Not on file  Social Needs   Financial resource strain: Not on file   Food insecurity    Worry: Not on file    Inability: Not on file   Transportation needs    Medical: Not on file    Non-medical: Not on file  Tobacco Use    Smoking status: Never Smoker   Smokeless tobacco: Never Used  Substance and Sexual Activity   Alcohol use: Yes   Drug use: Never   Sexual activity: Yes    Birth control/protection: None, Condom  Lifestyle   Physical activity    Days per week: Not on file    Minutes per session: Not on file   Stress: Not on file  Relationships   Social connections    Talks on phone: Not on file    Gets together: Not on file    Attends religious service: Not on file    Active member of club or organization: Not on file    Attends meetings of clubs or organizations: Not on file    Relationship status: Not on file   Intimate partner violence    Fear of current or ex partner: Not on file    Emotionally abused: Not on file    Physically abused: Not on file    Forced sexual activity: Not on file  Other Topics Concern   Not on file  Social History Narrative   Not on file    No Known Allergies  Prior to Admission medications   Medication Sig Start Date End Date Taking? Authorizing Provider  pantoprazole (PROTONIX) 20 MG tablet Take 20 mg by mouth daily.   Yes [provider]    Physical Exam BP 124/80    Ht 5\' 2"  (1.575 m)    Wt 199 lb (90.3 kg)    LMP 09/14/2019 (Exact Date)    BMI 36.40 kg/m    General: NAD HEENT: normocephalic, anicteric Pulmonary: No increased work of breathing Extremities: no edema, erythema, or tenderness Neurologic: Grossly intact, normal gait Psychiatric: mood appropriate, affect full  Imaging Results US Pelvis Transvaginal Non-ob (tv Only)  Result Date: 10/07/2019 Patient Name: Kayla West DOB: 08/30/1976 MRN: [id number] ULTRASOUND REPORT Location: [facility] Date of Service: 10/07/2019 Indications:Abnormal Uterine Bleeding Findings: The uterus is anteverted and measures 9.4 x 5.2 x 4.7 cm. Echo texture is homogenous without evidence of focal masses. The Endometrium measures 15.9 mm. There are areas that appear cystic and are small in  size. Right Ovary measures 3.2 x 3.1 x 2.4 cm. It is normal in appearance. Left Ovary measures 2.4 x 2.1 x 1.4 cm. It is normal in appearance. Survey of the adnexa demonstrates no adnexal masses. There is no free fluid in the cul de sac. Impression: 1. Thickened endometrium with small cystic areas 2. Otherwise, normal pelvic ultrasound. Gweneth Dimitri, RT The ultrasound images and findings were reviewed by me and I agree with the above report. Prentice Docker, MD, [facility], [facility] 10/07/2019 11:18 AM       Assessment: 43 y.o. G3P2013  1. History of miscarriage   2. Thickened endometrium   3. Borborygmi   4. Encounter for initial prescription of contraceptive pills      Plan: Problem List Items Addressed This Visit    None    Visit Diagnoses    History of miscarriage    -  Primary   Relevant Orders   US Transvaginal Non-OB ([facility])   Thickened endometrium       Relevant Orders   US Transvaginal Non-OB ([facility])   Borborygmi       Relevant Orders   Ambulatory referral to Gastroenterology   Encounter for initial prescription of contraceptive pills       Relevant Medications   norgestimate-ethinyl estradiol (SPRINTEC 28) 0.25-35 MG-MCG tablet     DIscussed u/s findings. WIll repeat after her menses as the findings today are non-specific.    She would like to start oral contraceptive pills. Rx sent.  Referral to GI made for borborymi.  15 minutes spent in face to face discussion with > 50% spent in counseling,management, and coordination of care of her history of miscarriage, thickened endometrium, contraceptive management.   Prentice Docker, MD, [facility], [facility] 10/07/2019 11:47 AM

## 2019-10-20 ENCOUNTER — Telehealth: Payer: Self-pay | Admitting: Obstetrics and Gynecology

## 2019-10-20 NOTE — Telephone Encounter (Signed)
Patient was schedule for 10/21/19 for Ultrasound and follow up. I was able to reschedule patient to 10/29/19. Patient reports it will be day 17 of cycle. Please advise if she need to reschedule to her next cycle.

## 2019-10-20 NOTE — Telephone Encounter (Signed)
I would reschedule her to her next cycle. I need the ultrasound to be right after or just at the end of a cycle. thanks

## 2019-10-21 ENCOUNTER — Ambulatory Visit: Payer: BC Managed Care – PPO | Admitting: Obstetrics and Gynecology

## 2019-10-21 ENCOUNTER — Other Ambulatory Visit: Payer: BC Managed Care – PPO

## 2019-10-21 DIAGNOSIS — M531 Cervicobrachial syndrome: Secondary | ICD-10-CM | POA: Diagnosis not present

## 2019-10-21 DIAGNOSIS — M5386 Other specified dorsopathies, lumbar region: Secondary | ICD-10-CM | POA: Diagnosis not present

## 2019-10-21 DIAGNOSIS — M9903 Segmental and somatic dysfunction of lumbar region: Secondary | ICD-10-CM | POA: Diagnosis not present

## 2019-10-21 DIAGNOSIS — M9902 Segmental and somatic dysfunction of thoracic region: Secondary | ICD-10-CM | POA: Diagnosis not present

## 2019-10-21 NOTE — Telephone Encounter (Signed)
Patient is reschedule to 11/16/19

## 2019-10-29 ENCOUNTER — Ambulatory Visit: Payer: BC Managed Care – PPO | Admitting: Obstetrics and Gynecology

## 2019-10-29 ENCOUNTER — Ambulatory Visit: Payer: BC Managed Care – PPO

## 2019-11-16 ENCOUNTER — Ambulatory Visit (INDEPENDENT_AMBULATORY_CARE_PROVIDER_SITE_OTHER): Payer: BC Managed Care – PPO | Admitting: Obstetrics and Gynecology

## 2019-11-16 ENCOUNTER — Ambulatory Visit (INDEPENDENT_AMBULATORY_CARE_PROVIDER_SITE_OTHER): Payer: BC Managed Care – PPO

## 2019-11-16 ENCOUNTER — Other Ambulatory Visit: Payer: Self-pay

## 2019-11-16 ENCOUNTER — Telehealth: Payer: Self-pay | Admitting: Obstetrics and Gynecology

## 2019-11-16 VITALS — BP 122/84 | Ht 63.0 in | Wt 204.0 lb

## 2019-11-16 DIAGNOSIS — R9389 Abnormal findings on diagnostic imaging of other specified body structures: Secondary | ICD-10-CM

## 2019-11-16 DIAGNOSIS — N840 Polyp of corpus uteri: Secondary | ICD-10-CM

## 2019-11-16 NOTE — Telephone Encounter (Signed)
Patient is aware of H&P/consents on day of surgery, Pre-admit testing phone interview to be scheduled, COVID testing on 1/15 @ 8-10:30am at [facility], and OR on 12/08/19. Patient is aware to quarantine after COVID testing. Patient is aware she may receive calls from the [facility]. Patient confirmed [employer], and no secondary insurance.

## 2019-11-16 NOTE — Telephone Encounter (Signed)
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.0) Secondary Diagnosis and Code:  Surgical Procedure: Hysteroscopy, dilation and curettage, endometrial polypectomy L&D Notification: No Admission Status: same day surgery Length of Surgery: 40 minutes Special Case Needs: Yes, MyoSure H&P:  can get H&P/consents day of surgery Phone Interview???:  Yes Interpreter: No Language:  Medical Clearance:  No Special Scheduling Instructions: MyoSure Any known health/anesthesia issues, diabetes, sleep apnea, latex allergy, defibrillator/pacemaker?: No Acuity: P3   (P1 highest, P2 delay may cause harm, P3 low, elective gyn, P4 lowest)   High level of anxiety from patient. So, is wanting surgery as soon as we can get it.

## 2019-11-16 NOTE — H&P (View-Only) (Signed)
Gynecology Ultrasound Follow Up   Chief Complaint  Patient presents with  . Follow-up  ultrasound for history of miscarriage   History of Present Illness: Patient is a 43 y.o. female who presents today for ultrasound evaluation of the above .  Ultrasound demonstrates the following findings Adnexa: no masses seen  Uterus: anteverted with endometrial stripe  15.2 mm Additional: echogenic mass within the endometrial canal, measuring 0.9 x 1.0 x 1.2 cm.   Past Medical History:  Diagnosis Date  . No pertinent past medical history     Past Surgical History:  Procedure Laterality Date  . BREAST LUMPECTOMY Left    benign indication  . CESAREAN SECTION     2019, 2015    Family History  Problem Relation Age of Onset  . Pancreatic cancer Mother 35  . Breast cancer Maternal Aunt        twice-50s and 60s  . Breast cancer Maternal Grandmother        twice- 90s and 27s    Social History   Socioeconomic History  . Marital status: Married    Spouse name: Not on file  . Number of children: Not on file  . Years of education: Not on file  . Highest education level: Not on file  Occupational History  . Not on file  Tobacco Use  . Smoking status: Never Smoker  . Smokeless tobacco: Never Used  Substance and Sexual Activity  . Alcohol use: Yes  . Drug use: Never  . Sexual activity: Yes    Birth control/protection: None, Condom  Other Topics Concern  . Not on file  Social History Narrative  . Not on file   Social Determinants of Health   Financial Resource Strain:   . Difficulty of Paying Living Expenses: Not on file  Food Insecurity:   . Worried About Programme researcher, broadcasting/film/video in the Last Year: Not on file  . Ran Out of Food in the Last Year: Not on file  Transportation Needs:   . Lack of Transportation (Medical): Not on file  . Lack of Transportation (Non-Medical): Not on file  Physical Activity:   . Days of Exercise per Week: Not on file  . Minutes of Exercise per  Session: Not on file  Stress:   . Feeling of Stress : Not on file  Social Connections:   . Frequency of Communication with Friends and Family: Not on file  . Frequency of Social Gatherings with Friends and Family: Not on file  . Attends Religious Services: Not on file  . Active Member of Clubs or Organizations: Not on file  . Attends Banker Meetings: Not on file  . Marital Status: Not on file  Intimate Partner Violence:   . Fear of Current or Ex-Partner: Not on file  . Emotionally Abused: Not on file  . Physically Abused: Not on file  . Sexually Abused: Not on file    No Known Allergies  Prior to Admission medications   Medication Sig Start Date End Date Taking? Authorizing Provider  norgestimate-ethinyl estradiol (SPRINTEC 28) 0.25-35 MG-MCG tablet Take 1 tablet by mouth daily. 10/07/19   Conard Novak, MD  pantoprazole (PROTONIX) 20 MG tablet Take 20 mg by mouth daily.    [provider]    Physical Exam BP 122/84   Ht 5\' 3"  (1.6 m)   Wt 204 lb (92.5 kg)   LMP 11/06/2019   BMI 36.14 kg/m    General: NAD HEENT: normocephalic, anicteric  Pulmonary: No increased work of breathing Extremities: no edema, erythema, or tenderness Neurologic: Grossly intact, normal gait Psychiatric: mood appropriate, affect full  US Transvaginal Non-OB [facility])  Result Date: 11/16/2019 Patient Name: Kayla West DOB: 09-07-1976 MRN: [id number] ULTRASOUND REPORT Location: [facility] Date of Service: 11/16/2019 Indications:Abnormal Uterine Bleeding Findings: The uterus is anteverted and measures 9.8 x 4.9 x 4.3 cm. Echo texture is homogenous without evidence of focal masses. The Endometrium measures 15.2 mm. There is an echogenic mass in the endometrial canal. A polyp can not be ruled out. It measures 0.9 x 1.0 x 1.2 cm. Right Ovary measures 2.5 x 2.3 x 1.6 cm. It is normal in appearance. Left Ovary measures 3.7 x 2.4 x 2.7 cm. It is normal in appearance. Survey of the  adnexa demonstrates no adnexal masses. There is no free fluid in the cul de sac. Impression: 1. There is an echogenic mass within the endometrium 2. The ultrasound is otherwise normal. Gweneth Dimitri, RT The ultrasound images and findings were reviewed by me and I agree with the above report. Prentice Docker, MD, [facility], [facility] 11/16/2019 9:13 AM      Assessment: 43 y.o. G3P2013  1. Endometrial polyp      Plan: Problem List Items Addressed This Visit    None    Visit Diagnoses    Endometrial polyp    -  Primary      Discussed ultrasound findings. She has a high level of anxiety regarding abnormal findings due to her family history of cancer. She would like to be as aggressive as possible in diagnosing and treating the findings on her ultrasound. She was offered less aggressive measures. However, she prefers to move forward with Hysteroscopy, dilation and curettage, and endometrial polypectomy. Risks and benefits with alternatives explained to the patient and she greatly desires to proceed with the surgical route.   20 minutes spent in face to face discussion with > 50% spent in counseling,management, and coordination of care of her endometrial polyp.  Prentice Docker, MD, [facility], [facility] 11/16/2019 4:10 PM

## 2019-11-16 NOTE — Progress Notes (Addendum)
Gynecology Ultrasound Follow Up   Chief Complaint  Patient presents with  . Follow-up  ultrasound for history of miscarriage History and Physical for surgery.   History of Present Illness: Patient is a 43 y.o. female who presents today for ultrasound evaluation of the above .  Ultrasound demonstrates the following findings Adnexa: no masses seen  Uterus: anteverted with endometrial stripe  15.2 mm Additional: echogenic mass within the endometrial canal, measuring 0.9 x 1.0 x 1.2 cm.   Patient had a miscarriage in March.  This was the only miscarriage she has ever had.  In September she did have some spotting that was brown.  She had the return of regular periods.  She delivered twins in 11/2017.    With her miscarriage she had a spontaneous passage of the pregnancy. She was told she was about [redacted] week along.    She desires to figure out what the mass is in her endometrium.   Past Medical History:  Diagnosis Date  . No pertinent past medical history     Past Surgical History:  Procedure Laterality Date  . BREAST LUMPECTOMY Left    benign indication  . CESAREAN SECTION     2019, 2015    Family History  Problem Relation Age of Onset  . Pancreatic cancer Mother 17  . Breast cancer Maternal Aunt        twice-50s and 32s  . Breast cancer Maternal Grandmother        twice- 17s and 73s    Social History   Socioeconomic History  . Marital status: Married    Spouse name: Not on file  . Number of children: Not on file  . Years of education: Not on file  . Highest education level: Not on file  Occupational History  . Not on file  Tobacco Use  . Smoking status: Never Smoker  . Smokeless tobacco: Never Used  Substance and Sexual Activity  . Alcohol use: Yes  . Drug use: Never  . Sexual activity: Yes    Birth control/protection: None, Condom  Other Topics Concern  . Not on file  Social History Narrative  . Not on file   Social Determinants of Health   Financial  Resource Strain:   . Difficulty of Paying Living Expenses: Not on file  Food Insecurity:   . Worried About Charity fundraiser in the Last Year: Not on file  . Ran Out of Food in the Last Year: Not on file  Transportation Needs:   . Lack of Transportation (Medical): Not on file  . Lack of Transportation (Non-Medical): Not on file  Physical Activity:   . Days of Exercise per Week: Not on file  . Minutes of Exercise per Session: Not on file  Stress:   . Feeling of Stress : Not on file  Social Connections:   . Frequency of Communication with Friends and Family: Not on file  . Frequency of Social Gatherings with Friends and Family: Not on file  . Attends Religious Services: Not on file  . Active Member of Clubs or Organizations: Not on file  . Attends Archivist Meetings: Not on file  . Marital Status: Not on file  Intimate Partner Violence:   . Fear of Current or Ex-Partner: Not on file  . Emotionally Abused: Not on file  . Physically Abused: Not on file  . Sexually Abused: Not on file    No Known Allergies  Prior to Admission medications  Medication Sig Start Date End Date Taking? Authorizing Provider  norgestimate-ethinyl estradiol (SPRINTEC 28) 0.25-35 MG-MCG tablet Take 1 tablet by mouth daily. 10/07/19   Conard Novak, MD  pantoprazole (PROTONIX) 20 MG tablet Take 20 mg by mouth daily.    [provider]    Physical Exam BP 122/84   Ht 5\' 3"  (1.6 m)   Wt 204 lb (92.5 kg)   LMP 11/06/2019   BMI 36.14 kg/m    General: NAD HEENT: normocephalic, anicteric Pulmonary: No increased work of breathing, CTAB CV: RRR Extremities: no edema, erythema, or tenderness Neurologic: Grossly intact, normal gait Psychiatric: mood appropriate, affect full  11/08/2019 Transvaginal Non-OB US)  Result Date: 11/16/2019 Patient Name: Kayla West DOB: [birth year] MRN: [id number] ULTRASOUND REPORT Location: [facility] Date of Service: 11/16/2019  Indications:Abnormal Uterine Bleeding Findings: The uterus is anteverted and measures 9.8 x 4.9 x 4.3 cm. Echo texture is homogenous without evidence of focal masses. The Endometrium measures 15.2 mm. There is an echogenic mass in the endometrial canal. A polyp can not be ruled out. It measures 0.9 x 1.0 x 1.2 cm. Right Ovary measures 2.5 x 2.3 x 1.6 cm. It is normal in appearance. Left Ovary measures 3.7 x 2.4 x 2.7 cm. It is normal in appearance. Survey of the adnexa demonstrates no adnexal masses. There is no free fluid in the cul de sac. Impression: 1. There is an echogenic mass within the endometrium 2. The ultrasound is otherwise normal. 11/18/2019, RT The ultrasound images and findings were reviewed by me and I agree with the above report. Deanna Artis, MD, [facility], [facility] 11/16/2019 9:13 AM      Assessment: 43 y.o. G3P2013  1. Endometrial polyp      Plan: Problem List Items Addressed This Visit    None    Visit Diagnoses    Endometrial polyp    -  Primary      Discussed ultrasound findings. She has a high level of anxiety regarding abnormal findings due to her family history of cancer. She would like to be as aggressive as possible in diagnosing and treating the findings on her ultrasound. She was offered less aggressive measures. However, she prefers to move forward with Hysteroscopy, dilation and curettage, and endometrial polypectomy. Risks and benefits with alternatives explained to the patient and she greatly desires to proceed with the surgical route.   20 minutes spent in face to face discussion with > 50% spent in counseling,management, and coordination of care of her endometrial polyp.  48, MD, [facility], [facility] 11/16/2019 4:10 PM

## 2019-11-26 ENCOUNTER — Other Ambulatory Visit: Payer: Self-pay

## 2019-11-26 ENCOUNTER — Encounter
Admission: RE | Admit: 2019-11-26 | Discharge: 2019-11-26 | Disposition: A | Discharge status: Home / Self Care | Payer: BC Managed Care – PPO | Source: Ambulatory Visit | Attending: Obstetrics and Gynecology | Admitting: Obstetrics and Gynecology

## 2019-11-26 NOTE — Patient Instructions (Signed)
Your procedure is scheduled on: Tues 1/19 Report to Day Surgery. [facility] To find out your arrival time please call [phone] between 1PM - 3PM on Mon 1/18.  Remember: Instructions that are not followed completely may result in serious medical risk,  up to and including death, or upon the discretion of your surgeon and anesthesiologist your  surgery may need to be rescheduled.     _X__ 1. Do not eat food after midnight the night before your procedure.                 No gum chewing or hard candies. You may drink clear liquids up to 2 hours                 before you are scheduled to arrive for your surgery- DO not drink clear                 liquids within 2 hours of the start of your surgery.                 Clear Liquids include:  water, apple juice without pulp, clear carbohydrate                 drink such as Clearfast of Gatorade, Black Coffee or Tea (Do not add                 anything to coffee or tea).  __X__2.  On the morning of surgery brush your teeth with toothpaste and water, you                may rinse your mouth with mouthwash if you wish.  Do not swallow any toothpaste of mouthwash.     _X__ 3.  No Alcohol for 24 hours before or after surgery.   ___ 4.  Do Not Smoke or use e-cigarettes For 24 Hours Prior to Your Surgery.                 Do not use any chewable tobacco products for at least 6 hours prior to                 surgery.  ____  5.  Bring all medications with you on the day of surgery if instructed.   _x___  6.  Notify your doctor if there is any change in your medical condition      (cold, fever, infections).     Do not wear jewelry, make-up, hairpins, clips or nail polish. Do not wear lotions, powders, or perfumes. You may wear deodorant. Do not shave 48 hours prior to surgery. Men may shave face and neck. Do not bring valuables to the hospital.    [facility] is not responsible for any belongings or  valuables.  Contacts, dentures or bridgework may not be worn into surgery. Leave your suitcase in the car. After surgery it may be brought to your room. For patients admitted to the hospital, discharge time is determined by your treatment team.   Patients discharged the day of surgery will not be allowed to drive home.   Please read over the following fact sheets that you were given:     __x__ Take these medicines the morning of surgery with A SIP OF WATER:    1. pantoprazole (PROTONIX) 40 MG tablet  Dose the night before and morning of surgery  2.   3.   4.  5.  6.  ____ Fleet Enema (as directed)  __x__ Shower the night before and the morning of surgeyr as directed  ____ Use inhalers on the day of surgery  ____ Stop metformin 2 days prior to surgery    ____ Take 1/2 of usual insulin dose the night before surgery. No insulin the morning          of surgery.   ____ Stop Coumadin/Plavix/aspirin   _x___ Stop Anti-inflammatories  Ibuprofen aleve and aspirin products  On 1/12  May take tylenol    __x__ Stop supplements .  Vitamin Mixture (ESTER-C PO)  On 1/12  ____ Bring C-Pap to the hospital.

## 2019-12-04 ENCOUNTER — Other Ambulatory Visit
Admission: RE | Admit: 2019-12-04 | Discharge: 2019-12-04 | Disposition: A | Discharge status: Home / Self Care | Payer: BC Managed Care – PPO | Source: Ambulatory Visit | Attending: Obstetrics and Gynecology | Admitting: Obstetrics and Gynecology

## 2019-12-04 ENCOUNTER — Other Ambulatory Visit: Payer: Self-pay

## 2019-12-04 DIAGNOSIS — Z20822 Contact with and (suspected) exposure to covid-19: Secondary | ICD-10-CM | POA: Insufficient documentation

## 2019-12-04 DIAGNOSIS — Z01812 Encounter for preprocedural laboratory examination: Secondary | ICD-10-CM | POA: Diagnosis not present

## 2019-12-05 LAB — SARS CORONAVIRUS 2 (TAT 6-24 HRS): SARS Coronavirus 2: NEGATIVE

## 2019-12-08 ENCOUNTER — Encounter
Admission: RE | Disposition: A | Discharge status: Home / Self Care | Payer: Self-pay | Source: Ambulatory Visit | Attending: Obstetrics and Gynecology

## 2019-12-08 ENCOUNTER — Ambulatory Visit: Payer: BC Managed Care – PPO | Admitting: Anesthesiology

## 2019-12-08 ENCOUNTER — Other Ambulatory Visit: Payer: Self-pay

## 2019-12-08 ENCOUNTER — Encounter: Payer: Self-pay | Admitting: Obstetrics and Gynecology

## 2019-12-08 ENCOUNTER — Ambulatory Visit
Admission: RE | Admit: 2019-12-08 | Discharge: 2019-12-08 | Disposition: A | Discharge status: Home / Self Care | Payer: BC Managed Care – PPO | Source: Ambulatory Visit | Attending: Obstetrics and Gynecology | Admitting: Obstetrics and Gynecology

## 2019-12-08 DIAGNOSIS — K219 Gastro-esophageal reflux disease without esophagitis: Secondary | ICD-10-CM | POA: Diagnosis not present

## 2019-12-08 DIAGNOSIS — Z793 Long term (current) use of hormonal contraceptives: Secondary | ICD-10-CM | POA: Insufficient documentation

## 2019-12-08 DIAGNOSIS — N840 Polyp of corpus uteri: Secondary | ICD-10-CM

## 2019-12-08 DIAGNOSIS — Z79899 Other long term (current) drug therapy: Secondary | ICD-10-CM | POA: Insufficient documentation

## 2019-12-08 HISTORY — PX: DILATATION & CURETTAGE/HYSTEROSCOPY WITH MYOSURE: SHX6511

## 2019-12-08 HISTORY — DX: Polyp of corpus uteri: N84.0

## 2019-12-08 LAB — POCT URINE PREGNANCY: Preg Test, Ur: NEGATIVE

## 2019-12-08 SURGERY — DILATATION & CURETTAGE/HYSTEROSCOPY WITH MYOSURE
Anesthesia: General

## 2019-12-08 MED ORDER — FENTANYL CITRATE (PF) 100 MCG/2ML IJ SOLN: 25.0000 ug | INTRAMUSCULAR | Status: DC | PRN

## 2019-12-08 MED ORDER — OXYCODONE HCL 5 MG PO TABS: 5.0000 mg | ORAL_TABLET | Freq: Once | ORAL | Status: DC | PRN

## 2019-12-08 MED ORDER — ONDANSETRON HCL 4 MG/2ML IJ SOLN
INTRAMUSCULAR | Status: DC | PRN
  Administered 2019-12-08: 4 mg via INTRAVENOUS

## 2019-12-08 MED ORDER — MIDAZOLAM HCL 2 MG/2ML IJ SOLN
INTRAMUSCULAR | Status: AC
  Filled 2019-12-08: qty 2

## 2019-12-08 MED ORDER — DEXAMETHASONE SODIUM PHOSPHATE 10 MG/ML IJ SOLN
INTRAMUSCULAR | Status: DC | PRN
  Administered 2019-12-08: 10 mg via INTRAVENOUS

## 2019-12-08 MED ORDER — LACTATED RINGERS IV SOLN: INTRAVENOUS | Status: DC | PRN

## 2019-12-08 MED ORDER — OXYCODONE HCL 5 MG/5ML PO SOLN: 5.0000 mg | Freq: Once | ORAL | Status: DC | PRN

## 2019-12-08 MED ORDER — PROPOFOL 10 MG/ML IV BOLUS
INTRAVENOUS | Status: AC
  Filled 2019-12-08: qty 20

## 2019-12-08 MED ORDER — FENTANYL CITRATE (PF) 100 MCG/2ML IJ SOLN
INTRAMUSCULAR | Status: DC | PRN
  Administered 2019-12-08: 25 ug via INTRAVENOUS
  Administered 2019-12-08: 50 ug via INTRAVENOUS

## 2019-12-08 MED ORDER — MIDAZOLAM HCL 2 MG/2ML IJ SOLN
INTRAMUSCULAR | Status: DC | PRN
  Administered 2019-12-08: 2 mg via INTRAVENOUS

## 2019-12-08 MED ORDER — PROPOFOL 10 MG/ML IV BOLUS
INTRAVENOUS | Status: DC | PRN
  Administered 2019-12-08: 200 ug via INTRAVENOUS

## 2019-12-08 MED ORDER — LACTATED RINGERS IV SOLN: INTRAVENOUS | Status: DC

## 2019-12-08 MED ORDER — LIDOCAINE HCL (CARDIAC) PF 100 MG/5ML IV SOSY
PREFILLED_SYRINGE | INTRAVENOUS | Status: DC | PRN
  Administered 2019-12-08: 100 mg via INTRAVENOUS

## 2019-12-08 MED ORDER — KETOROLAC TROMETHAMINE 30 MG/ML IJ SOLN
INTRAMUSCULAR | Status: DC | PRN
  Administered 2019-12-08: 30 mg via INTRAVENOUS

## 2019-12-08 MED ORDER — PROPOFOL 10 MG/ML IV BOLUS: INTRAVENOUS | Status: DC | PRN

## 2019-12-08 MED ORDER — SILVER NITRATE-POT NITRATE 75-25 % EX MISC
CUTANEOUS | Status: DC | PRN
  Administered 2019-12-08: 7 via TOPICAL

## 2019-12-08 MED ORDER — FENTANYL CITRATE (PF) 100 MCG/2ML IJ SOLN
INTRAMUSCULAR | Status: AC
  Filled 2019-12-08: qty 2

## 2019-12-08 MED ORDER — IBUPROFEN 600 MG PO TABS: 600.0000 mg | ORAL_TABLET | Freq: Four times a day (QID) | ORAL | 0 refills | Status: DC | PRN

## 2019-12-08 MED ORDER — SEVOFLURANE IN SOLN
RESPIRATORY_TRACT | Status: AC
  Filled 2019-12-08: qty 250

## 2019-12-08 SURGICAL SUPPLY — 26 items
BAG URINE DRAIN 2000ML AR STRL (UROLOGICAL SUPPLIES) IMPLANT
CANISTER SUCT 3000ML PPV (MISCELLANEOUS) ×1 IMPLANT
CATH FOLEY 2WAY  5CC 16FR (CATHETERS)
CATH ROBINSON RED A/P 16FR (CATHETERS) ×3 IMPLANT
CATH URTH 16FR FL 2W BLN LF (CATHETERS) IMPLANT
COVER WAND RF STERILE (DRAPES) ×3 IMPLANT
DEVICE MYOSURE LITE (MISCELLANEOUS) ×2 IMPLANT
ELECT REM PT RETURN 9FT ADLT (ELECTROSURGICAL) ×3
ELECTRODE REM PT RTRN 9FT ADLT (ELECTROSURGICAL) ×1 IMPLANT
GLOVE BIO SURGEON STRL SZ7 (GLOVE) ×11 IMPLANT
GLOVE BIOGEL PI IND STRL 7.5 (GLOVE) ×1 IMPLANT
GLOVE BIOGEL PI INDICATOR 7.5 (GLOVE) ×10
GOWN STRL REUS W/ TWL LRG LVL3 (GOWN DISPOSABLE) ×2 IMPLANT
GOWN STRL REUS W/TWL LRG LVL3 (GOWN DISPOSABLE) ×4
IV LACTATED RINGERS 1000ML (IV SOLUTION) ×1 IMPLANT
IV NS IRRIG 3000ML ARTHROMATIC (IV SOLUTION) ×2 IMPLANT
KIT PROCEDURE FLUENT (KITS) ×2 IMPLANT
KIT TURNOVER CYSTO (KITS) ×3 IMPLANT
MYOSURE XL FIBROID (MISCELLANEOUS)
PACK DNC HYST (MISCELLANEOUS) ×3 IMPLANT
PAD OB MATERNITY 4.3X12.25 (PERSONAL CARE ITEMS) ×3 IMPLANT
PAD PREP 24X41 OB/GYN DISP (PERSONAL CARE ITEMS) ×3 IMPLANT
SEAL ROD LENS SCOPE MYOSURE (ABLATOR) ×3 IMPLANT
SYSTEM TISS REMOVAL MYOSURE XL (MISCELLANEOUS) IMPLANT
TUBING CONNECTING 10 (TUBING) ×2 IMPLANT
TUBING CONNECTING 10' (TUBING) ×1

## 2019-12-08 NOTE — Interval H&P Note (Signed)
History and Physical Interval Note:  12/08/2019 7:29 AM  Kayla West  has presented today for surgery, with the diagnosis of Endometrial polyp N84.0.  The various methods of treatment have been discussed with the patient and family. After consideration of risks, benefits and other options for treatment, the patient has consented to  Procedure(s): DILATATION & CURETTAGE/HYSTEROSCOPY WITH MYOSURE (N/A) , removal of endometrial mass as a surgical intervention.  The patient's history has been reviewed, patient examined, no change in status, stable for surgery.  I have reviewed the patient's chart and labs.  Questions were answered to the patient's satisfaction.    Thomasene Mohair, MD, [facility], [facility] 12/08/2019 7:29 AM

## 2019-12-08 NOTE — Discharge Instructions (Signed)

## 2019-12-08 NOTE — Op Note (Signed)
Operative Note   12/08/2019  PREOPERATIVE DIAGNOSIS:  Endometrial polypoid lesion   POSTOPERATIVE DIAGNOSIS: Endometrial polypoid lesion   SURGEON: Surgeon(s) and Role:    Conard Novak, MD - Primary  PROCEDURE: Procedure(s): 1) Hysteroscopy 2) Dilation and Curettage 3) Endometrial polypectomy  ANESTHESIA: General LMA  ESTIMATED BLOOD LOSS: 1 mL  DRAINS: none   TOTAL IV FLUIDS: 1,000 mL crystalloid  SPECIMENS:  Endometrial curettings and endometrial polypoid lesions  VTE PROPHYLAXIS: SCDs to the bilateral lower extremities  ANTIBIOTICS: none indicated, none given  FLUID DEFICIT: 85 mL  COMPLICATIONS: none  DISPOSITION: PACU - hemodynamically stable.  CONDITION: stable  INDICATION: 44 y.o. female who ultrasound findings of an echogenic lesion who desired removal and diagnosis.   FINDINGS: Exam under anesthesia revealed small, mobile anteverted uterus with no masses and bilateral adnexa without masses or fullness. Hysteroscopy revealed a grossly normal appearing uterine cavity with the exception of a small posterior-left polypoid lesion with bilateral tubal ostia and normal appearing endocervical canal.  PROCEDURE IN DETAIL:  After informed consent was obtained, the patient was taken to the operating room where anesthesia was obtained without difficulty. The patient was positioned in the dorsal lithotomy position in candy cane stirrups.  The patient was examined under anesthesia, with the above noted findings.  The bi-valved speculum was placed inside the patient's vagina, and the the anterior lip of the cervix was grasped with the tenaculum.  The cervix was progressively dilated to a 7 mm Hegar dilator.  The hysteroscope was introduced, with the above noted findings.  The MyoSure Light device was utilized to remove the polypoid lesions from the uterine cavity.  There was a small endometrial cystic area that was unroofed, as well.    The hystersocope was removed and  the uterine cavity was curetted to obtain a global sample.  The hysteroscope was re-introduced and hemostasis was noted with the lowering of the intrauterine pressure to well below that of the MAP.  All instruments were removed, with hemostasis noted on the cervix after application of silver nitrate at the tenaculum sites.  She was then taken out of dorsal lithotomy. The patient tolerated the procedure well.  Sponge, lap and needle counts were correct x2.  The patient was taken to recovery room in excellent condition.  Conard Novak, MD, FACOG 12/08/2019 8:44 AM

## 2019-12-08 NOTE — Transfer of Care (Signed)
Immediate Anesthesia Transfer of Care Note  Patient: Kayla West  Procedure(s) Performed: DILATATION & CURETTAGE/HYSTEROSCOPY WITH MYOSURE POLYPECOMTY (N/A )  Patient Location: PACU  Anesthesia Type:General  Level of Consciousness: awake and alert   Airway & Oxygen Therapy: Patient Spontanous Breathing and Patient connected to face mask oxygen  Post-op Assessment: Report given to RN and Post -op Vital signs reviewed and stable  Post vital signs: Reviewed and stable  Last Vitals:  Vitals Value Taken Time  BP 127/90 12/08/19 0859  Temp    Pulse 95 12/08/19 0902  Resp 11 12/08/19 0902  SpO2 100 % 12/08/19 0902  Vitals shown include unvalidated device data.  Last Pain:  Vitals:   12/08/19 0623  TempSrc: Tympanic  PainSc: 0-No pain         Complications: No apparent anesthesia complications

## 2019-12-08 NOTE — Anesthesia Procedure Notes (Signed)
Procedure Name: LMA Insertion Date/Time: 12/08/2019 7:50 AM Performed by: Almeta Monas, CRNA Pre-anesthesia Checklist: Patient identified, Patient being monitored, Timeout performed, Emergency Drugs available and Suction available Patient Re-evaluated:Patient Re-evaluated prior to induction Oxygen Delivery Method: Circle system utilized Preoxygenation: Pre-oxygenation with 100% oxygen Induction Type: IV induction Ventilation: Mask ventilation without difficulty LMA: LMA inserted LMA Size: 3.5 Tube type: Oral Number of attempts: 1 Placement Confirmation: positive ETCO2 and breath sounds checked- equal and bilateral Tube secured with: Tape Dental Injury: Teeth and Oropharynx as per pre-operative assessment

## 2019-12-08 NOTE — Anesthesia Preprocedure Evaluation (Signed)
Anesthesia Evaluation  Patient identified by MRN, date of birth, ID band Patient awake    Reviewed: Allergy & Precautions, H&P , NPO status , Patient's Chart, lab work & pertinent test results  History of Anesthesia Complications Negative for: history of anesthetic complications  Airway Mallampati: II  TM Distance: >3 FB Neck ROM: full    Dental  (+) Chipped   Pulmonary neg pulmonary ROS, neg shortness of breath,           Cardiovascular Exercise Tolerance: Good (-) angina(-) Past MI and (-) DOE negative cardio ROS       Neuro/Psych negative neurological ROS  negative psych ROS   GI/Hepatic Neg liver ROS, GERD  Medicated and Controlled,  Endo/Other  negative endocrine ROS  Renal/GU      Musculoskeletal   Abdominal   Peds  Hematology negative hematology ROS (+)   Anesthesia Other Findings Past Medical History: No date: GERD (gastroesophageal reflux disease) No date: No pertinent past medical history  Past Surgical History: No date: BREAST LUMPECTOMY; Left     Comment:  benign indication No date: CESAREAN SECTION     Comment:  2019, 2015     Reproductive/Obstetrics negative OB ROS                             Anesthesia Physical Anesthesia Plan  ASA: II  Anesthesia Plan: General LMA   Post-op Pain Management:    Induction: Intravenous  PONV Risk Score and Plan: Dexamethasone, Ondansetron, Midazolam and Treatment may vary due to age or medical condition  Airway Management Planned: LMA  Additional Equipment:   Intra-op Plan:   Post-operative Plan: Extubation in OR  Informed Consent: I have reviewed the patients History and Physical, chart, labs and discussed the procedure including the risks, benefits and alternatives for the proposed anesthesia with the patient or authorized representative who has indicated his/her understanding and acceptance.     Dental Advisory  Given  Plan Discussed with: Anesthesiologist, CRNA and Surgeon  Anesthesia Plan Comments: (Patient consented for risks of anesthesia including but not limited to:  - adverse reactions to medications - damage to teeth, lips or other oral mucosa - sore throat or hoarseness - Damage to heart, brain, lungs or loss of life  Patient voiced understanding.)        Anesthesia Quick Evaluation

## 2019-12-09 ENCOUNTER — Ambulatory Visit: Payer: BC Managed Care – PPO | Admitting: Gastroenterology

## 2019-12-10 ENCOUNTER — Telehealth: Payer: Self-pay | Admitting: Obstetrics and Gynecology

## 2019-12-10 LAB — SURGICAL PATHOLOGY

## 2019-12-10 NOTE — Anesthesia Postprocedure Evaluation (Signed)
Anesthesia Post Note  Patient: CHRYL HOLTEN  Procedure(s) Performed: DILATATION & CURETTAGE/HYSTEROSCOPY WITH MYOSURE POLYPECOMTY (N/A )  Patient location during evaluation: PACU Anesthesia Type: General Level of consciousness: awake and alert Pain management: pain level controlled Vital Signs Assessment: post-procedure vital signs reviewed and stable Respiratory status: spontaneous breathing, nonlabored ventilation, respiratory function stable and patient connected to nasal cannula oxygen Cardiovascular status: blood pressure returned to baseline and stable Postop Assessment: no apparent nausea or vomiting Anesthetic complications: no     Last Vitals:  Vitals:   12/08/19 0926 12/08/19 0941  BP: 124/69 126/78  Pulse: 88 92  Resp: 19 14  Temp: 36.8 C 37.2 C  SpO2: 98% 100%    Last Pain:  Vitals:   12/08/19 0941  TempSrc: Temporal  PainSc: 0-No pain                 Yevette Edwards

## 2019-12-10 NOTE — Telephone Encounter (Signed)
Left generic VM for her to check her results and let me know, if she has any questions.

## 2020-01-06 ENCOUNTER — Other Ambulatory Visit: Payer: Self-pay

## 2020-01-06 ENCOUNTER — Encounter: Payer: Self-pay | Admitting: Gastroenterology

## 2020-01-06 ENCOUNTER — Ambulatory Visit (INDEPENDENT_AMBULATORY_CARE_PROVIDER_SITE_OTHER): Payer: BC Managed Care – PPO | Admitting: Gastroenterology

## 2020-01-06 VITALS — BP 133/85 | HR 88 | Temp 97.7°F | Ht 63.0 in | Wt 203.5 lb

## 2020-01-06 DIAGNOSIS — R198 Other specified symptoms and signs involving the digestive system and abdomen: Secondary | ICD-10-CM

## 2020-01-06 DIAGNOSIS — K625 Hemorrhage of anus and rectum: Secondary | ICD-10-CM

## 2020-01-06 NOTE — Progress Notes (Signed)
Arlyss Repress, MD [address]  [address]  [place], [place] [address]  Main: [phone]  Fax: [phone]    Gastroenterology Consultation  Referring Provider:     Conard Novak, MD Primary Care Physician:  Conard Novak, MD Primary Gastroenterologist:  Dr. Arlyss Repress Reason for Consultation: Borborygmi        HPI:   Kayla West is a 44 y.o. female referred by Dr. Jean Rosenthal, Mila Homer, MD  for consultation & management of borborygmi.  Patient started to experience symptoms of gurgling sounds in her abdomen since October last year associated with some abdominal cramps and fecal urgency.  She works as a Automotive engineer at [employer].  She moved from [place] and in the midst of pandemic, she was worried about her job security and she thinks her symptoms were situational secondary to anxiety.  She noticed that she is no longer experiencing the above symptoms as her job is stable and she is enjoying her work.  She also noticed trace/scant amount of blood on wiping during that episode.  She is no longer experiencing rectal bleeding.  She denies weight loss, nausea or vomiting.  She does experience mild abdominal bloating when she is anxious.  She denies any particular relation to food.  She was referred by her OB/GYN for further evaluation of her GI symptoms.  She had mild anemia during her pregnancy in 2019.  NSAIDs: None  Antiplts/Anticoagulants/Anti thrombotics: None  GI Procedures: None She denies family history of GI malignancy Patient does not smoke or drink alcohol  Past Medical History:  Diagnosis Date  . GERD (gastroesophageal reflux disease)   . No pertinent past medical history     Past Surgical History:  Procedure Laterality Date  . BREAST LUMPECTOMY Left    benign indication  . CESAREAN SECTION     2019, 2015  . DILATATION & CURETTAGE/HYSTEROSCOPY WITH MYOSURE N/A 12/08/2019   Procedure: DILATATION & CURETTAGE/HYSTEROSCOPY WITH  MYOSURE POLYPECOMTY;  Surgeon: Conard Novak, MD;  Location: [facility] ORS;  Service: Gynecology;  Laterality: N/A;    Current Outpatient Medications:  .  acidophilus (RISAQUAD) CAPS capsule, Take 1 capsule by mouth daily., Disp: , Rfl:  .  cholecalciferol (VITAMIN D3) 25 MCG (1000 UT) tablet, Take 5,000 Units by mouth daily., Disp: , Rfl:  .  ibuprofen (ADVIL) 600 MG tablet, Take 1 tablet (600 mg total) by mouth every 6 (six) hours as needed for mild pain, moderate pain or cramping., Disp: 30 tablet, Rfl: 0 .  Magnesium 250 MG TABS, Take 250 mg by mouth daily., Disp: , Rfl:  .  Multiple Vitamin (MULTIVITAMIN WITH MINERALS) TABS tablet, Take 1 tablet by mouth daily., Disp: , Rfl:  .  norgestimate-ethinyl estradiol (SPRINTEC 28) 0.25-35 MG-MCG tablet, Take 1 tablet by mouth daily., Disp: 84 tablet, Rfl: 4 .  pantoprazole (PROTONIX) 40 MG tablet, Take 40 mg by mouth daily. , Disp: , Rfl:  .  Vitamin Mixture (ESTER-C PO), Take 2 tablets by mouth daily., Disp: , Rfl:    Family History  Problem Relation Age of Onset  . Pancreatic cancer Mother 21  . Breast cancer Maternal Aunt        twice-50s and 60s  . Breast cancer Maternal Grandmother        twice- 97s and 48s     Social History   Tobacco Use  . Smoking status: Never Smoker  . Smokeless tobacco: Never Used  Substance Use Topics  .  Alcohol use: Not Currently    Comment: occassion  . Drug use: Never    Allergies as of 01/06/2020  . (No Known Allergies)    Review of Systems:    All systems reviewed and negative except where noted in HPI.   Physical Exam:  BP 133/85 (BP Location: Left Arm, Patient Position: Sitting, Cuff Size: Normal)   Pulse 88   Temp 97.7 F (36.5 C) (Oral)   Ht 5\' 3"  (1.6 m)   Wt 203 lb 8 oz (92.3 kg)   BMI 36.05 kg/m  No LMP recorded.  General:   Alert,  Well-developed, well-nourished, pleasant and cooperative in NAD Head:  Normocephalic and atraumatic. Eyes:  Sclera clear, no icterus.    Conjunctiva pink. Ears:  Normal auditory acuity. Nose:  No deformity, discharge, or lesions. Mouth:  No deformity or lesions,oropharynx pink & moist. Neck:  Supple; no masses or thyromegaly. Lungs:  Respirations even and unlabored.  Clear throughout to auscultation.   No wheezes, crackles, or rhonchi. No acute distress. Heart:  Regular rate and rhythm; no murmurs, clicks, rubs, or gallops. Abdomen:  Normal bowel sounds. Soft, non-tender and non-distended without masses, hepatosplenomegaly or hernias noted.  No guarding or rebound tenderness.   Rectal: Not performed Msk:  Symmetrical without gross deformities. Good, equal movement & strength bilaterally. Pulses:  Normal pulses noted. Extremities:  No clubbing or edema.  No cyanosis. Neurologic:  Alert and oriented x3;  grossly normal neurologically. Skin:  Intact without significant lesions or rashes. No jaundice. Psych:  Alert and cooperative. Normal mood and affect.  Imaging Studies: No abdominal imaging  Assessment and Plan:   Kayla West is a 44 y.o. female with no significant past medical history is seen in consultation for borborygmi.  Patient does not have any other red flags.  This was about 3 months ago and it appeared to be secondary to situational anxiety.  Her symptoms are currently resolved.  Patient has been provided reassurance and given her samples of FD guard as needed  Reassured her regarding self-limited episode of scant rectal bleeding and advised her to contact my office if she notices any recurrence of her symptoms   Follow up as needed   Cephas Darby, MD

## 2020-01-07 ENCOUNTER — Encounter: Payer: Self-pay | Admitting: Gastroenterology

## 2020-01-21 ENCOUNTER — Other Ambulatory Visit: Payer: Self-pay

## 2020-01-21 ENCOUNTER — Ambulatory Visit: Payer: BC Managed Care – PPO | Attending: Internal Medicine

## 2020-01-21 DIAGNOSIS — Z23 Encounter for immunization: Secondary | ICD-10-CM | POA: Insufficient documentation

## 2020-01-21 NOTE — Progress Notes (Signed)
   Covid-19 Vaccination Clinic  Name:  MAYLINE DRAGON    MRN: [id number] DOB: 1975-12-19  01/21/2020  Ms. Broda was observed post Covid-19 immunization for 15 minutes without incident. She was provided with Vaccine Information Sheet and instruction to access the V-Safe system.   Ms. Horine was instructed to call 911 with any severe reactions post vaccine: Marland Kitchen Difficulty breathing  . Swelling of face and throat  . A fast heartbeat  . A bad rash all over body  . Dizziness and weakness   Immunizations Administered    Name Date Dose VIS Date Route   Pfizer COVID-19 Vaccine 01/21/2020 10:50 AM 0.3 mL 10/30/2019 Intramuscular   Manufacturer: [employer], [employer]   Lot: [id number]   NDC: [address]-6

## 2020-02-11 ENCOUNTER — Ambulatory Visit: Payer: BC Managed Care – PPO | Attending: Internal Medicine

## 2020-02-11 DIAGNOSIS — Z23 Encounter for immunization: Secondary | ICD-10-CM

## 2020-02-11 NOTE — Progress Notes (Signed)
   Covid-19 Vaccination Clinic  Name:  Kayla West    MRN: [id number] DOB: [birth year]  02/11/2020  Kayla West was observed post Covid-19 immunization for 15 minutes without incident. She was provided with Vaccine Information Sheet and instruction to access the V-Safe system.   Kayla West was instructed to call 911 with any severe reactions post vaccine: Marland Kitchen Difficulty breathing  . Swelling of face and throat  . A fast heartbeat  . A bad rash all over body  . Dizziness and weakness   Immunizations Administered    Name Date Dose VIS Date Route   Pfizer COVID-19 Vaccine 02/11/2020 10:26 AM 0.3 mL 10/30/2019 Intramuscular   Manufacturer: [employer], [employer]   Lot: [id number]   NDC: [address]-5

## 2020-04-06 DIAGNOSIS — J029 Acute pharyngitis, unspecified: Secondary | ICD-10-CM | POA: Diagnosis not present

## 2020-04-15 ENCOUNTER — Telehealth: Payer: Self-pay | Admitting: Obstetrics and Gynecology

## 2020-04-15 NOTE — Telephone Encounter (Signed)
Patient is being advised by Dr. Glennon Mac to get the Myriad testing done due to family history. When patient called her insurance company they said they would cover the lab if we were to get an prior authorization prior to the draw. Would you look into this. Patient is schedule for 04/19/20 at 10 am. Please advise

## 2020-04-15 NOTE — Telephone Encounter (Signed)
Spoke to Tome will bein charge of submitting PA. Pt just needs to come in and get drawn. Pt aware. Family history updated in chart.

## 2020-04-19 ENCOUNTER — Ambulatory Visit: Payer: BC Managed Care – PPO

## 2020-04-19 ENCOUNTER — Other Ambulatory Visit: Payer: Self-pay

## 2020-04-19 ENCOUNTER — Encounter: Payer: Self-pay | Admitting: Obstetrics and Gynecology

## 2020-04-19 DIAGNOSIS — Z1371 Encounter for nonprocreative screening for genetic disease carrier status: Secondary | ICD-10-CM

## 2020-04-19 DIAGNOSIS — Z9189 Other specified personal risk factors, not elsewhere classified: Secondary | ICD-10-CM

## 2020-04-19 DIAGNOSIS — Z803 Family history of malignant neoplasm of breast: Secondary | ICD-10-CM | POA: Diagnosis not present

## 2020-04-19 DIAGNOSIS — Z808 Family history of malignant neoplasm of other organs or systems: Secondary | ICD-10-CM | POA: Diagnosis not present

## 2020-04-19 HISTORY — DX: Encounter for nonprocreative screening for genetic disease carrier status: Z13.71

## 2020-04-19 HISTORY — DX: Other specified personal risk factors, not elsewhere classified: Z91.89

## 2020-04-21 NOTE — Progress Notes (Signed)
Pt came in and had blood drawn for Myriad testing. Pt tolerated it well. 

## 2020-05-10 ENCOUNTER — Encounter: Payer: Self-pay | Admitting: Obstetrics and Gynecology

## 2020-05-18 ENCOUNTER — Telehealth: Payer: Self-pay

## 2020-05-18 NOTE — Telephone Encounter (Signed)
Per SDJ, pls call pt to schedule in office visit for myriad results.

## 2020-05-18 NOTE — Telephone Encounter (Signed)
Pt calling for BRCA results.  [phone]

## 2020-05-18 NOTE — Telephone Encounter (Signed)
Scheduled

## 2020-05-24 ENCOUNTER — Other Ambulatory Visit: Payer: Self-pay | Admitting: Obstetrics and Gynecology

## 2020-05-24 DIAGNOSIS — Z803 Family history of malignant neoplasm of breast: Secondary | ICD-10-CM

## 2020-05-24 NOTE — Progress Notes (Signed)
MyRisk order placed, done 04/19/20 but order needed to link scanned in results.

## 2020-05-25 ENCOUNTER — Ambulatory Visit: Payer: BC Managed Care – PPO | Admitting: Obstetrics and Gynecology

## 2020-05-25 ENCOUNTER — Other Ambulatory Visit: Payer: Self-pay

## 2020-05-25 ENCOUNTER — Encounter: Payer: Self-pay | Admitting: Obstetrics and Gynecology

## 2020-05-25 VITALS — BP 154/89 | HR 102 | Ht 62.0 in | Wt 196.0 lb

## 2020-05-25 DIAGNOSIS — Z803 Family history of malignant neoplasm of breast: Secondary | ICD-10-CM

## 2020-05-25 DIAGNOSIS — Z800 Family history of malignant neoplasm of digestive organs: Secondary | ICD-10-CM | POA: Diagnosis not present

## 2020-05-25 DIAGNOSIS — Z1239 Encounter for other screening for malignant neoplasm of breast: Secondary | ICD-10-CM | POA: Diagnosis not present

## 2020-05-25 NOTE — Progress Notes (Signed)
   Chief Complaint  Patient presents with  . Follow-up    Myriad results   HPI:   Ms. Kayla West is a 44 y.o. [phone] who LMP was No LMP recorded., presents today for  My Risk cancer genetic testing results.  Results are negative for a cancer genetic mutation.  IBIS=24.5 % Riskscore=16.5 % Last mammogram 10/01/2019 - BiRads 1   Family History  Problem Relation Age of Onset  . Pancreatic cancer Mother 51  . Breast cancer Maternal Aunt        twice-50s and 96s  . Breast cancer Maternal Grandmother        twice- 67s and 54s  . Breast cancer Maternal Aunt 77     Assessment/Plan:  Recommended monthly SBE, twice yearly CBE, yearly mammos and add Vit D3 2000 IU dialy. Also offered yearly screening breast MRI, staggered with mammos. Pt to do MRI first and f/u by February for mammography.   Patient understands these results only apply to her and her children, and this is not indicative of genetic testing results of her other family members. It is recommended that her other family members have genetic testing done.  Pt also understands negative genetic testing doesn't mean she will never get any of these cancers.   Results handout given to pt.  37 Min spent in direct contact with pt re: counseling/coordination of care.   Prentice Docker, MD 05/26/2020 8:38 PM     30 min spent in direct contact with pt re: counseling   rx MRI breast

## 2020-05-26 ENCOUNTER — Encounter: Payer: Self-pay | Admitting: Obstetrics and Gynecology

## 2020-05-27 DIAGNOSIS — D485 Neoplasm of uncertain behavior of skin: Secondary | ICD-10-CM | POA: Diagnosis not present

## 2020-05-27 DIAGNOSIS — L578 Other skin changes due to chronic exposure to nonionizing radiation: Secondary | ICD-10-CM | POA: Diagnosis not present

## 2020-05-27 DIAGNOSIS — L811 Chloasma: Secondary | ICD-10-CM | POA: Diagnosis not present

## 2020-06-02 DIAGNOSIS — F4323 Adjustment disorder with mixed anxiety and depressed mood: Secondary | ICD-10-CM | POA: Diagnosis not present

## 2020-06-09 ENCOUNTER — Telehealth: Payer: Self-pay

## 2020-06-09 ENCOUNTER — Other Ambulatory Visit: Payer: Self-pay | Admitting: Obstetrics and Gynecology

## 2020-06-09 DIAGNOSIS — Z803 Family history of malignant neoplasm of breast: Secondary | ICD-10-CM

## 2020-06-09 DIAGNOSIS — Z1239 Encounter for other screening for malignant neoplasm of breast: Secondary | ICD-10-CM

## 2020-06-09 NOTE — Telephone Encounter (Signed)
Kayla West calling from [facility] MRI; needs to speak to someone about the way MRI breast is ordered.  [phone]  Kayla West states the order needs to be MRI Breast with and without Bilateral.  If questions please call her.

## 2020-06-09 NOTE — Telephone Encounter (Signed)
Clydie Braun states new order is just fine.

## 2020-06-09 NOTE — Telephone Encounter (Signed)
So, I changed the order to be MRI breast with and without contrast.  Can you make sure that OK?

## 2020-06-14 ENCOUNTER — Ambulatory Visit
Admission: RE | Admit: 2020-06-14 | Discharge: 2020-06-14 | Disposition: A | Discharge status: Home / Self Care | Payer: BC Managed Care – PPO | Source: Ambulatory Visit | Attending: Obstetrics and Gynecology | Admitting: Obstetrics and Gynecology

## 2020-06-14 ENCOUNTER — Other Ambulatory Visit: Payer: Self-pay

## 2020-06-14 DIAGNOSIS — Z1239 Encounter for other screening for malignant neoplasm of breast: Secondary | ICD-10-CM | POA: Diagnosis not present

## 2020-06-14 DIAGNOSIS — R928 Other abnormal and inconclusive findings on diagnostic imaging of breast: Secondary | ICD-10-CM | POA: Diagnosis not present

## 2020-06-14 DIAGNOSIS — Z803 Family history of malignant neoplasm of breast: Secondary | ICD-10-CM | POA: Insufficient documentation

## 2020-06-14 IMAGING — MR MR BREAST BILAT WO/W CM
2 of 9 series · 6 of 48 positions shown · IV contrast (8ml Gadavist)
Comparison: Previous exam(s).

CLINICAL DATA: 43-year-old female presenting for high risk
screening MRI. History of 2 benign left breast biopsies.

LABS:  None performed on site.
EXAM:
BILATERAL BREAST MRI WITH AND WITHOUT CONTRAST
TECHNIQUE: Multiplanar, multisequence MR images of both breasts were obtained
prior to and following the intravenous administration of 8 ml of
Gadavist.

[Series 2: T1 · axial · B · 1.5mm · 1.02mm/px · z∈[-78,+88]mm · 5 of 112 slices shown]
[im 1/112]
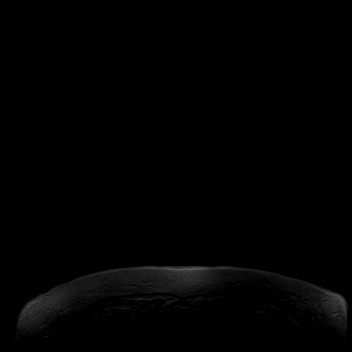
[im 28/112]
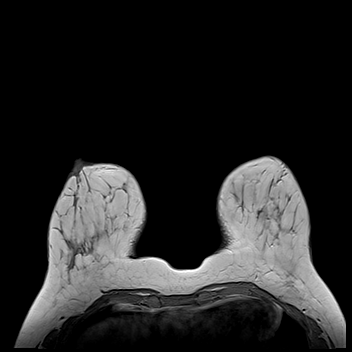
[im 56/112]
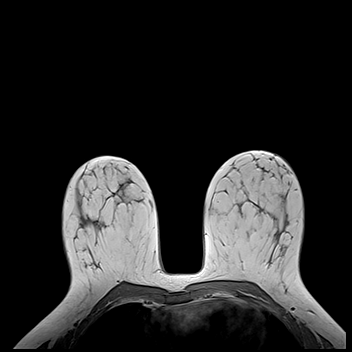
[im 84/112]
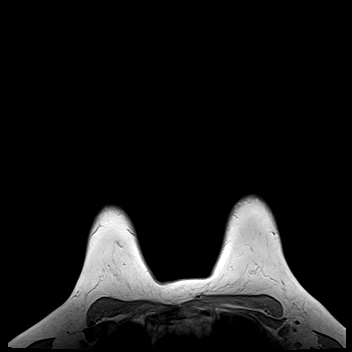
[im 112/112]
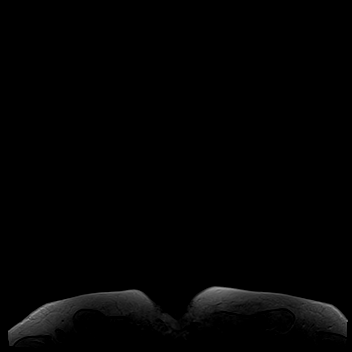

[Series 3: T2 · axial · B · 3.0mm · 1.02mm/px · 1 of 46 slices shown]
[im 1/46]
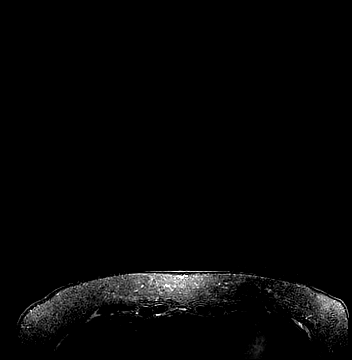

[6 of 48 positions shown; findings below may reference images not displayed]

Three-dimensional MR images were rendered by post-processing of the
original MR data on an independent workstation. The
three-dimensional MR images were interpreted, and findings are
reported in the following complete MRI report for this study. Three
dimensional images were evaluated at the independent [employer]
workstation.
FINDINGS: Breast composition: b. Scattered fibroglandular tissue.

Background parenchymal enhancement: Moderate.

Right breast: There are two enhancing 4-5 mm irregular foci in the
upper outer right breast at mid to posterior depth (series 10, image
50/112). They demonstrate color uptake with intermediate plateau
kinetics. No other suspicious mass or abnormal enhancement.

Left breast: No suspicious mass or abnormal enhancement.

Lymph nodes: No abnormal appearing lymph nodes.

Ancillary findings:  None.
IMPRESSION: 1. Two indeterminate 4-5 mm enhancing foci in the upper-outer right
breast (series 10, image 50). Recommendation is for MRI guided
biopsy.
2. No MRI evidence of malignancy on the left.
3. No suspicious lymphadenopathy.

RECOMMENDATION:
Two area MRI guided biopsy of the right breast.

BI-RADS CATEGORY  4: Suspicious.

## 2020-06-14 MED ORDER — GADOBUTROL 1 MMOL/ML IV SOLN
8.0000 mL | Freq: Once | INTRAVENOUS | Status: AC | PRN
  Administered 2020-06-14: 8 mL via INTRAVENOUS

## 2020-06-15 ENCOUNTER — Other Ambulatory Visit: Payer: Self-pay | Admitting: Obstetrics and Gynecology

## 2020-06-15 DIAGNOSIS — N649 Disorder of breast, unspecified: Secondary | ICD-10-CM

## 2020-06-15 DIAGNOSIS — F4323 Adjustment disorder with mixed anxiety and depressed mood: Secondary | ICD-10-CM | POA: Diagnosis not present

## 2020-06-15 DIAGNOSIS — R928 Other abnormal and inconclusive findings on diagnostic imaging of breast: Secondary | ICD-10-CM

## 2020-06-15 NOTE — Telephone Encounter (Signed)
Can you please help with this pt as she wishes to proceed ASAP? SDJ out of office.

## 2020-06-16 ENCOUNTER — Other Ambulatory Visit: Payer: Self-pay | Admitting: Obstetrics and Gynecology

## 2020-06-16 DIAGNOSIS — R928 Other abnormal and inconclusive findings on diagnostic imaging of breast: Secondary | ICD-10-CM

## 2020-06-16 DIAGNOSIS — N649 Disorder of breast, unspecified: Secondary | ICD-10-CM

## 2020-06-20 ENCOUNTER — Other Ambulatory Visit: Payer: Self-pay

## 2020-06-20 ENCOUNTER — Other Ambulatory Visit (HOSPITAL_COMMUNITY): Payer: Self-pay | Admitting: Diagnostic Radiology

## 2020-06-20 ENCOUNTER — Ambulatory Visit
Admission: RE | Admit: 2020-06-20 | Discharge: 2020-06-20 | Disposition: A | Discharge status: Home / Self Care | Payer: BC Managed Care – PPO | Source: Ambulatory Visit | Attending: Obstetrics and Gynecology | Admitting: Obstetrics and Gynecology

## 2020-06-20 DIAGNOSIS — N649 Disorder of breast, unspecified: Secondary | ICD-10-CM

## 2020-06-20 DIAGNOSIS — R928 Other abnormal and inconclusive findings on diagnostic imaging of breast: Secondary | ICD-10-CM | POA: Diagnosis not present

## 2020-06-20 DIAGNOSIS — N6021 Fibroadenosis of right breast: Secondary | ICD-10-CM | POA: Diagnosis not present

## 2020-06-20 IMAGING — MR MR BREAST BX W/ LOC DEV EA ADD LESION IMAGE BX SPEC MR GUIDE*R*
7 of 10 series · 31 of 48 positions shown · IV contrast (8 ml gadavist)
Comparison: Previous exams.
COMPARISON: Previous exams.

Addendum:
CLINICAL DATA: 43-year-old with 2 separate 5 mm foci of enhancement
in the UPPER OUTER QUADRANT of the RIGHT breast on high risk
screening MRI.

EXAM:
MRI GUIDED CORE NEEDLE BIOPSY OF THE RIGHT BREAST x 2
TECHNIQUE: Multiplanar, multisequence MR imaging of the RIGHT breast was
performed both before and after administration of intravenous
contrast.
CONTRAST:  8mL GADAVIST GADOBUTROL 1 MMOL/ML IV.

[Series 2: fiducial unilateral · sagittal · 2.0mm · 1.33mm/px · 3 of 64 slices shown]
[im 1/64]
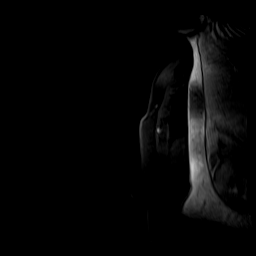
[im 32/64]
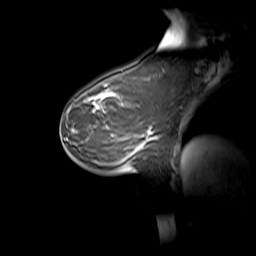
[im 64/64]
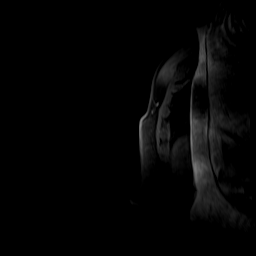

[Series 3: dynamic pre · axial · non-contrast · 1.3mm · 0.73mm/px · z∈[-89,+97]mm · 5 of 144 slices shown]
[im 1/144]
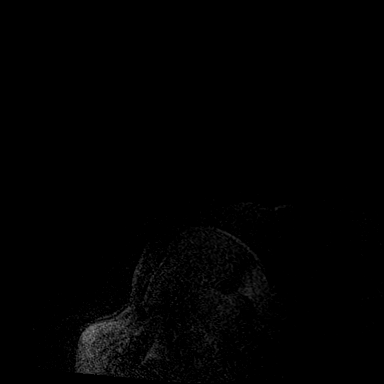
[im 36/144]
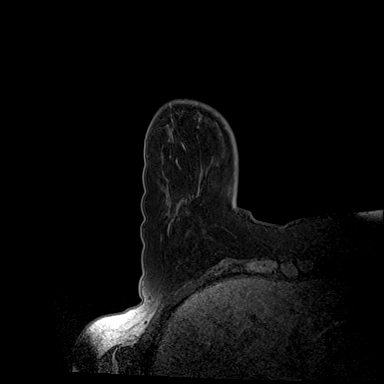
[im 72/144]
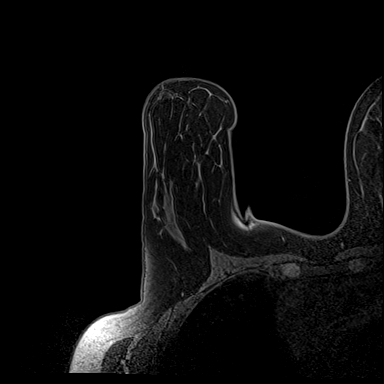
[im 108/144]
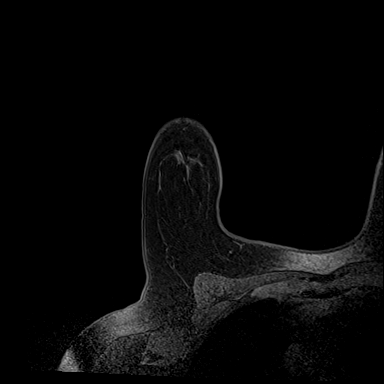
[im 144/144]
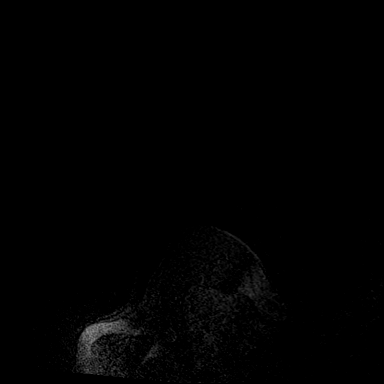

[Series 4: dynamic post 20 · axial · 1.3mm · 0.73mm/px · z∈[-89,+97]mm · 5 of 144 slices shown (1 of 2)]
[im 1/144]
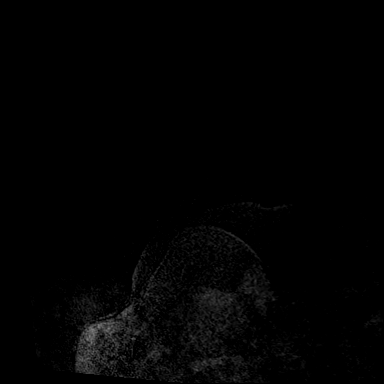
[im 36/144]
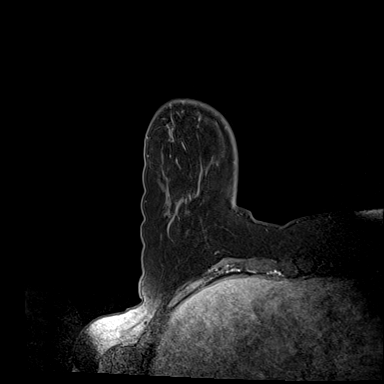
[im 72/144]
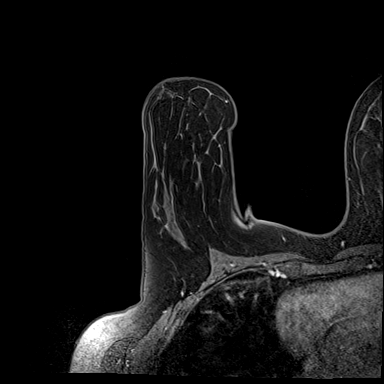
[im 108/144]
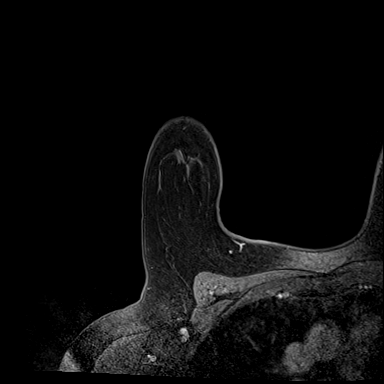
[im 144/144]
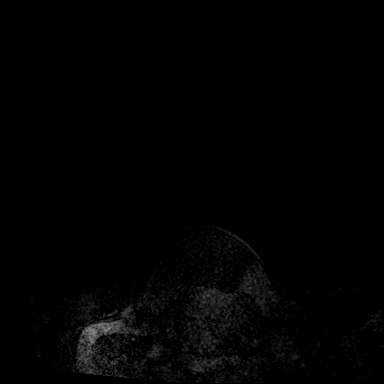

[Series 5: dynamic post 20 · axial · 1.3mm · 0.73mm/px · z∈[-89,+97]mm · 5 of 144 slices shown (2 of 2)]
[im 1/144]
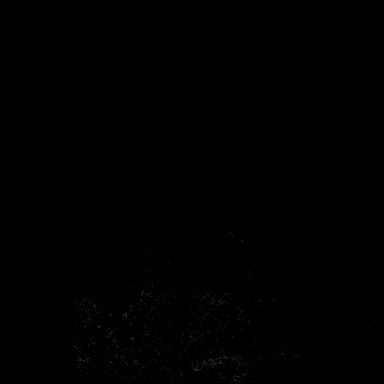
[im 36/144]
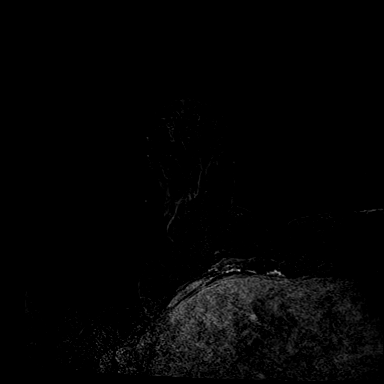
[im 72/144]
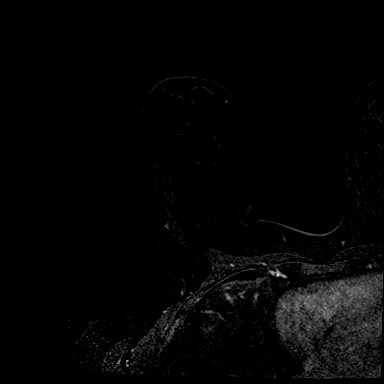
[im 108/144]
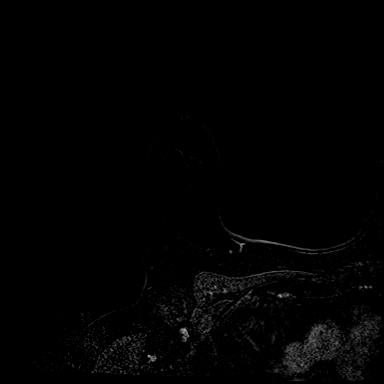
[im 144/144]
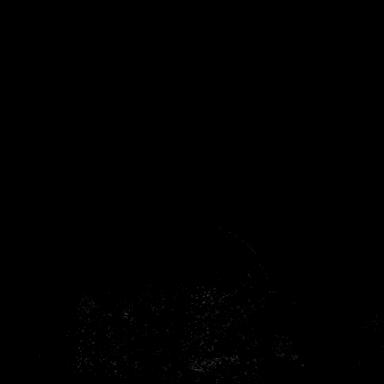

[Series 6: dynamic post 3 · axial · 1.3mm · 0.73mm/px · z∈[-89,+97]mm · 5 of 144 slices shown (1 of 2)]
[im 1/144]
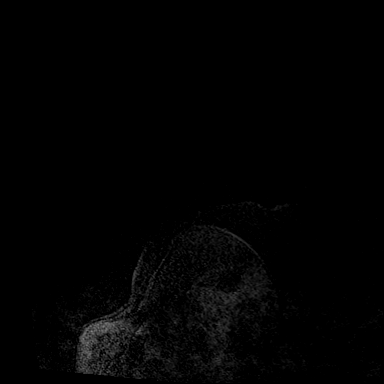
[im 36/144]
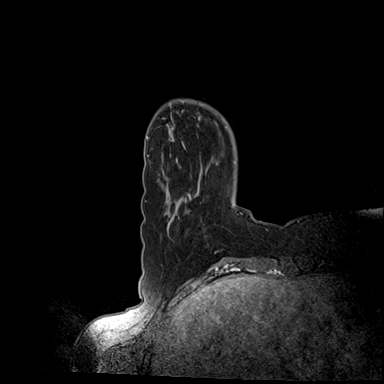
[im 72/144]
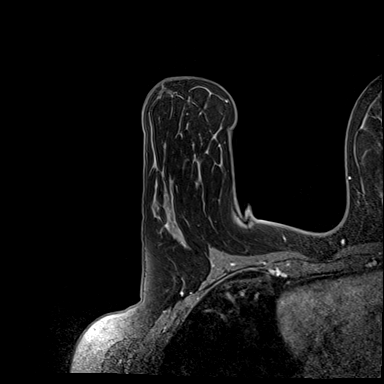
[im 108/144]
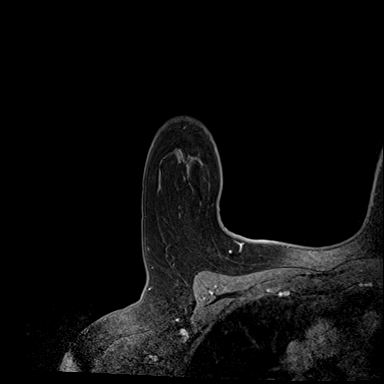
[im 144/144]
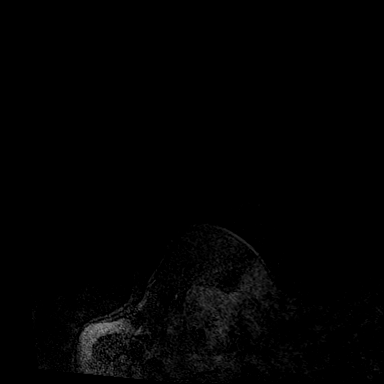

[Series 7: dynamic post 3 · axial · 1.3mm · 0.73mm/px · z∈[-89,+97]mm · 5 of 144 slices shown (2 of 2)]
[im 1/144]
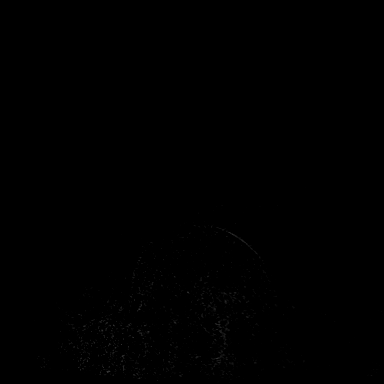
[im 36/144]
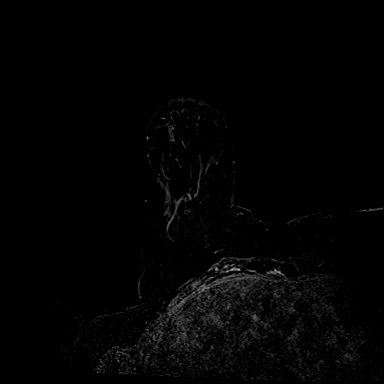
[im 72/144]
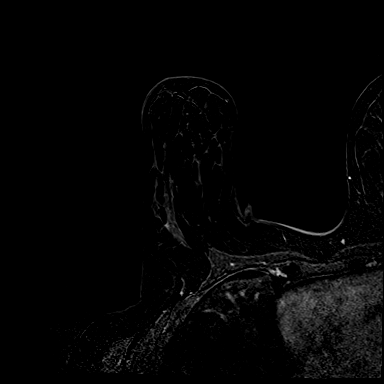
[im 108/144]
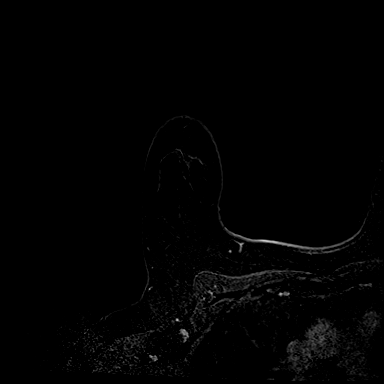
[im 144/144]
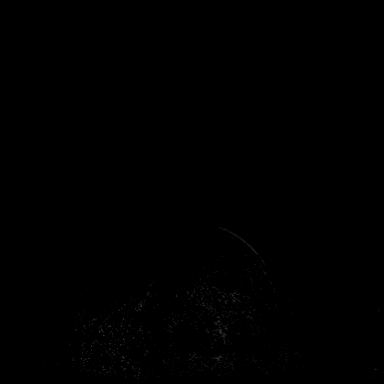

[Series 8: needle confirmation · axial · 1.3mm · 0.73mm/px · z∈[-89,+4]mm · 3 of 144 slices shown]
[im 1/144]
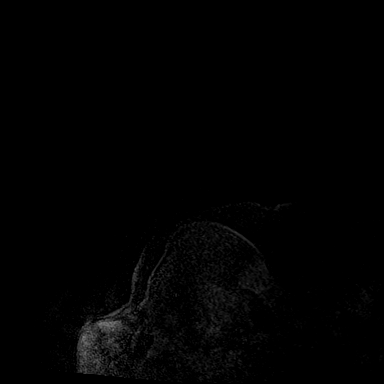
[im 36/144]
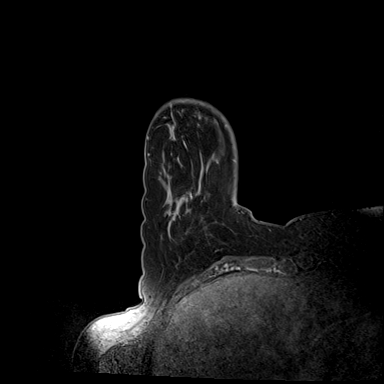
[im 72/144]
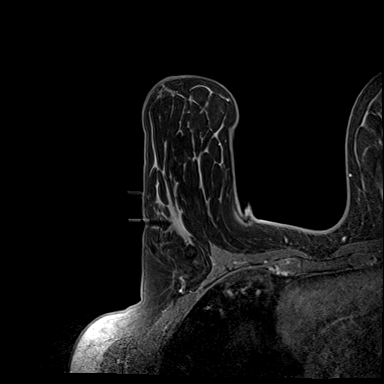

[31 of 48 positions shown; findings below may reference images not displayed]

FINDINGS: I met with the patient, and we discussed the procedure of MRI guided
biopsy, including risks, benefits, and alternatives. Specifically,
we discussed the risks of infection, bleeding, tissue injury, clip
migration, and inadequate sampling. Informed, written consent was
given. The usual time out protocol was performed immediately prior
to the procedure.

# 1) Lesion quadrant: RIGHT UPPER OUTER QUADRANT, MIDDLE depth.

Using sterile technique with chlorhexidine as skin antisepsis, 1%
lidocaine and 1% lidocaine with epinephrine, using MRI guidance, a 9
gauge vacuum assisted device was used perform biopsy of the
enhancing focus in the UPPER OUTER QUADRANT at MIDDLE to POSTERIOR
depth using a lateral approach. At the conclusion of the biopsy, a
barbell shaped tissue marker clip was deployed into the biopsy
cavity.

# 2) Lesion quadrant: RIGHT UPPER OUTER QUADRANT, POSTERIOR depth.

Using sterile technique with chlorhexidine as skin antisepsis, 1%
lidocaine and 1% lidocaine with epinephrine, using MRI guidance, a 9
gauge vacuum assisted device was used perform biopsy of the
enhancing focus in the UPPER OUTER QUADRANT at POSTERIOR depth using
a lateral approach. At the conclusion of the biopsy, a cylinder
shaped tissue marker clip was deployed into the biopsy cavity.

Follow-up 2-view mammogram was performed and dictated separately.
IMPRESSION: MRI guided biopsy of 2 separate enhancing foci in the UPPER OUTER
QUADRANT of the RIGHT breast. No apparent complications.

ADDENDUM:
Pathology revealed ADENOSIS AND CHRONIC INFLAMMATION of the RIGHT
breast, upper outer quadrant middle depth. This was found to be
concordant by Dr. SHOSHI.

Pathology revealed ADENOSIS AND CHRONIC INFLAMMATION of the RIGHT
breast, upper outer quadrant posterior depth. This was found to be
concordant by Dr. SHOSHI.

Pathology results were discussed with the patient by telephone. The
patient reported doing well after the biopsies with tenderness at
the sites. Post biopsy instructions and care were reviewed and
questions were answered.

The patient was instructed to return for annual screening
mammography in [DATE] and high risk supplemental screening
MRI in 1 year. Patient was informed a reminder notice would be sent
regarding this mammography appointment.

Pathology results reported by SHOSHI RN on [DATE].

*** End of Addendum ***
FINDINGS: I met with the patient, and we discussed the procedure of MRI guided
biopsy, including risks, benefits, and alternatives. Specifically,
we discussed the risks of infection, bleeding, tissue injury, clip
migration, and inadequate sampling. Informed, written consent was
given. The usual time out protocol was performed immediately prior
to the procedure.

# 1) Lesion quadrant: RIGHT UPPER OUTER QUADRANT, MIDDLE depth.

Using sterile technique with chlorhexidine as skin antisepsis, 1%
lidocaine and 1% lidocaine with epinephrine, using MRI guidance, a 9
gauge vacuum assisted device was used perform biopsy of the
enhancing focus in the UPPER OUTER QUADRANT at MIDDLE to POSTERIOR
depth using a lateral approach. At the conclusion of the biopsy, a
barbell shaped tissue marker clip was deployed into the biopsy
cavity.

# 2) Lesion quadrant: RIGHT UPPER OUTER QUADRANT, POSTERIOR depth.

Using sterile technique with chlorhexidine as skin antisepsis, 1%
lidocaine and 1% lidocaine with epinephrine, using MRI guidance, a 9
gauge vacuum assisted device was used perform biopsy of the
enhancing focus in the UPPER OUTER QUADRANT at POSTERIOR depth using
a lateral approach. At the conclusion of the biopsy, a cylinder
shaped tissue marker clip was deployed into the biopsy cavity.

Follow-up 2-view mammogram was performed and dictated separately.
IMPRESSION: MRI guided biopsy of 2 separate enhancing foci in the UPPER OUTER
QUADRANT of the RIGHT breast. No apparent complications.

## 2020-06-20 IMAGING — MG MM BREAST LOCALIZATION CLIP
4 series · 4 of 12 positions shown · non-contrast
Comparison: Previous exam(s).

CLINICAL DATA: Confirmation of clip placement after MRI guided
needle biopsies of 2 separate indeterminate enhancing foci in the
UPPER OUTER QUADRANT of the RIGHT breast.

EXAM:
2D AND TOMOSYNTHESIS DIAGNOSTIC RIGHT MAMMOGRAM POST MRI BIOPSY

[R ML synth-2D]
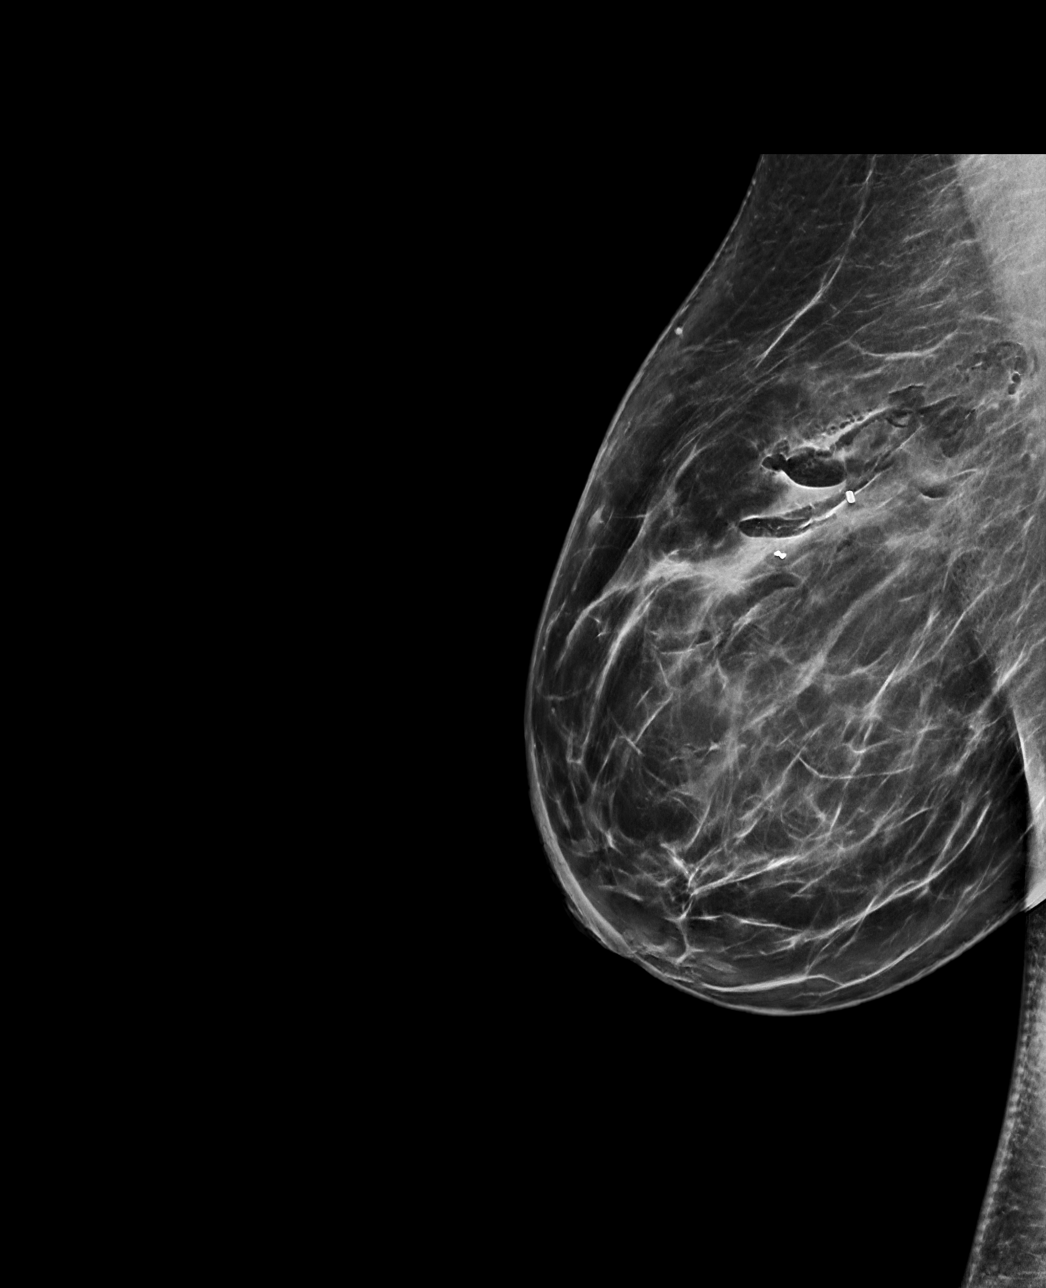

[R CC synth-2D]
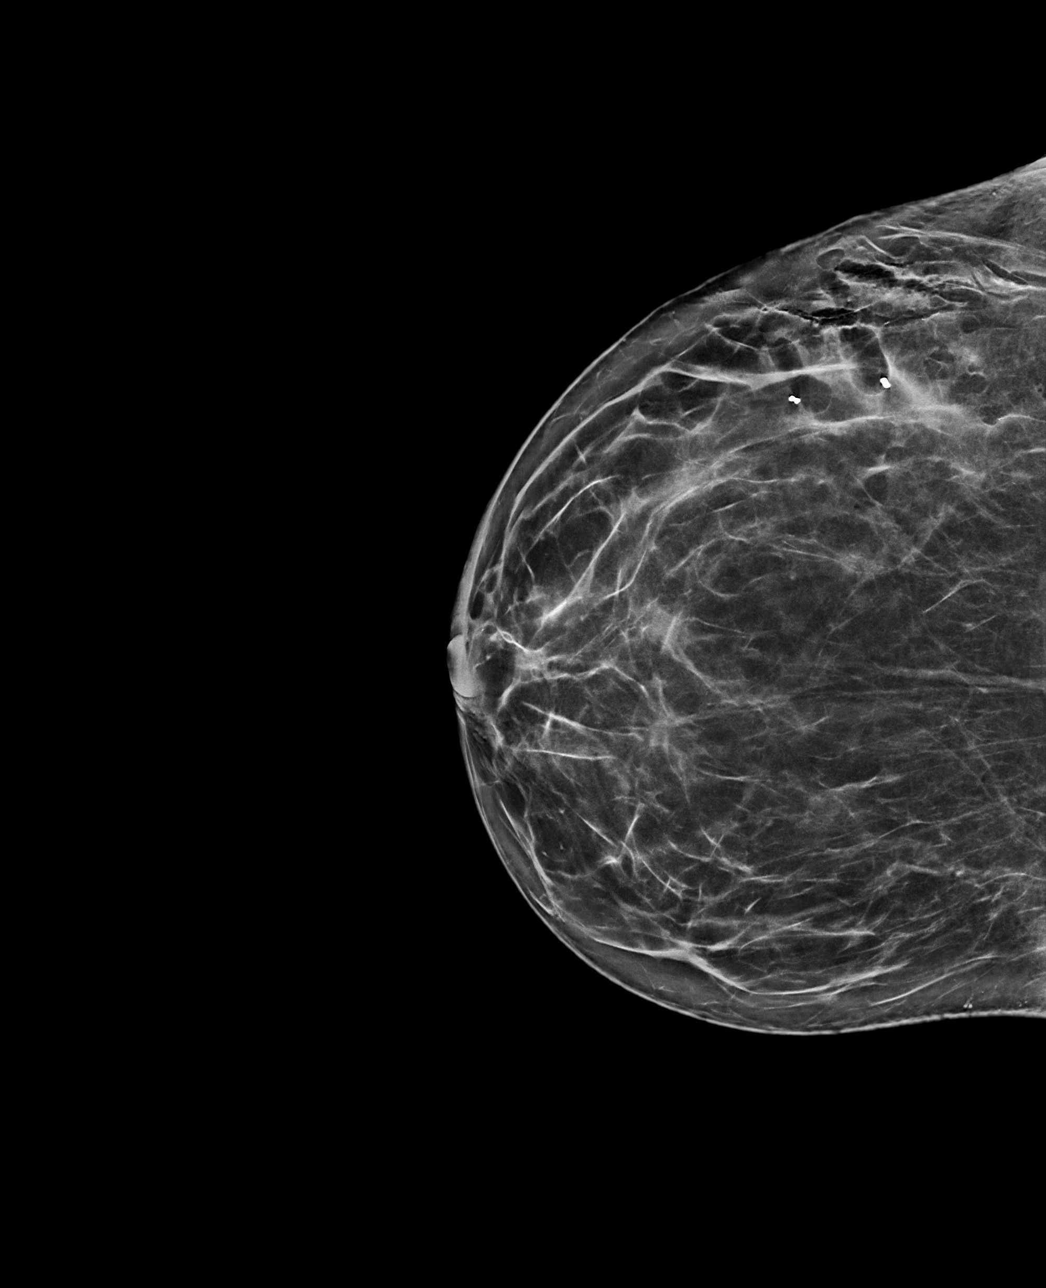

[R CC tomo · tomo slice 37/73.0]
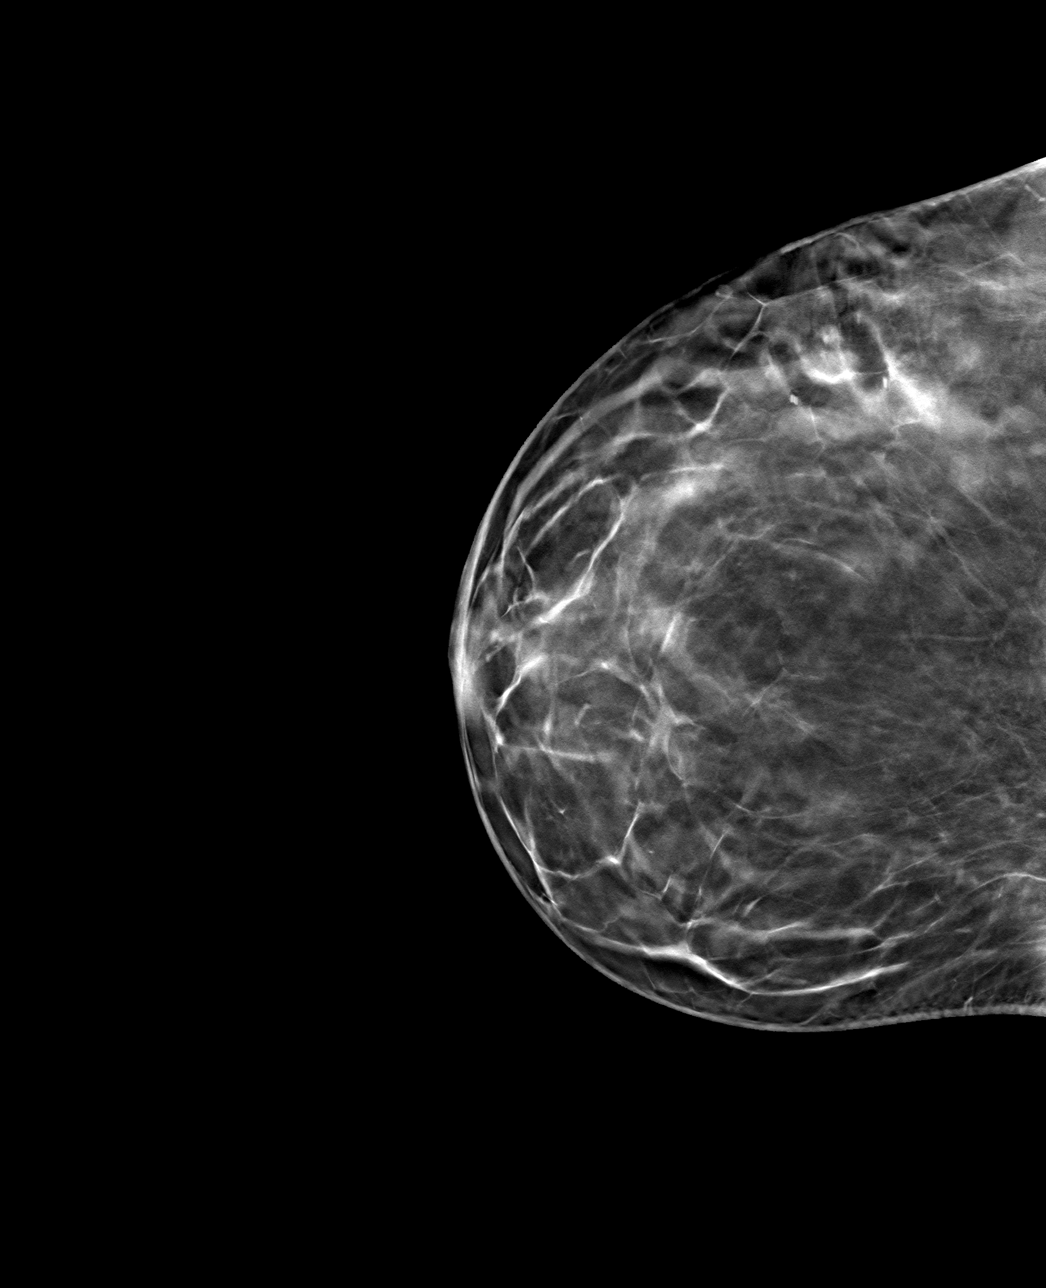

[R ML tomo · tomo slice 45/89.0]
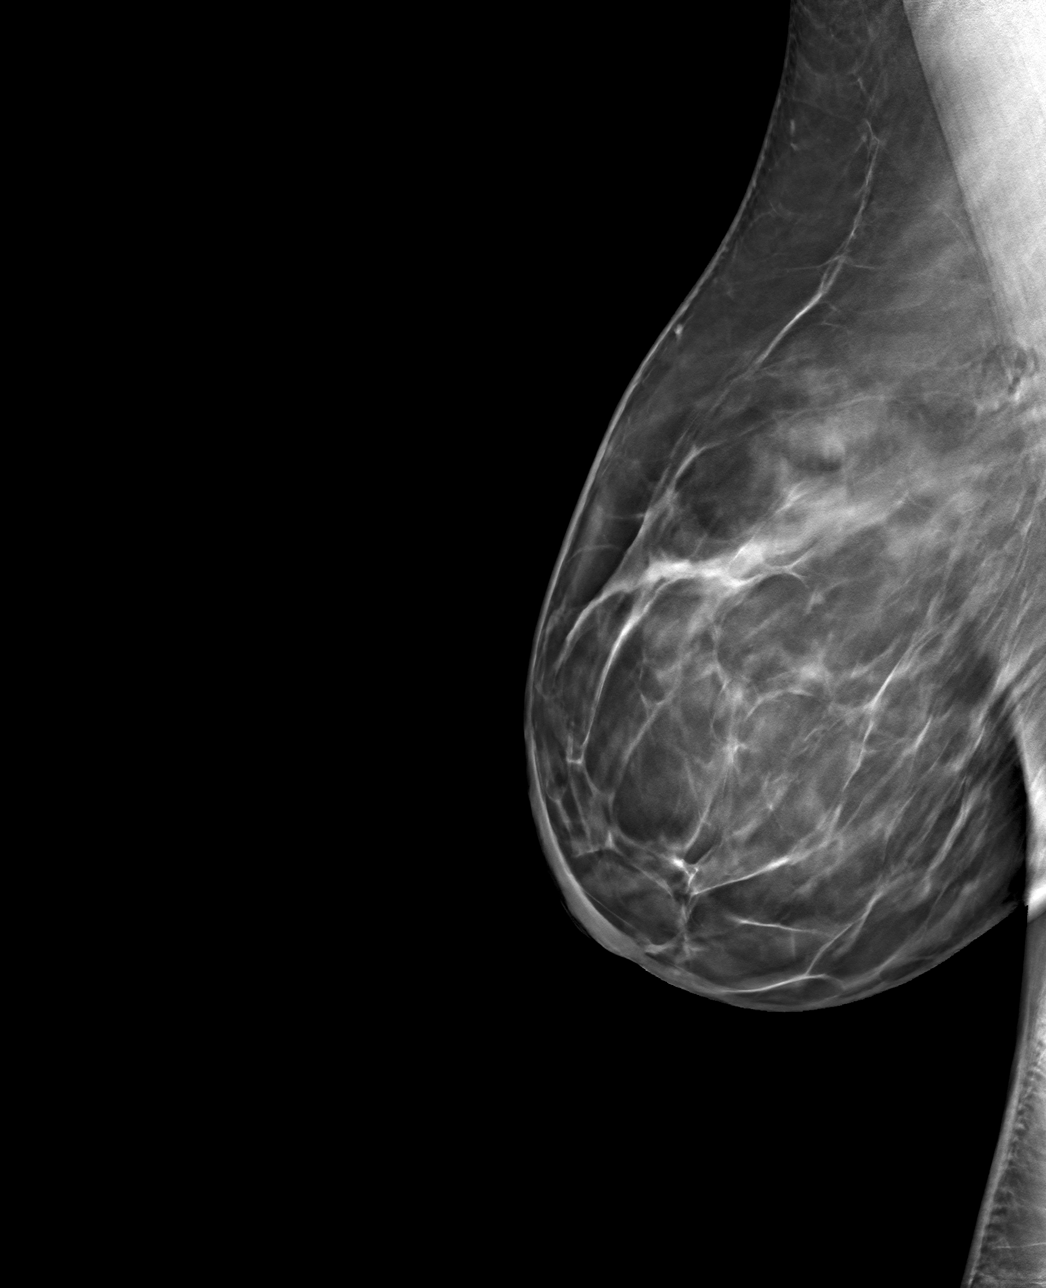

[4 of 12 positions shown; findings below may reference images not displayed]

FINDINGS: Tomosynthesis and synthesized full field CC and mediolateral images
were obtained following MRI guided biopsy of 2 separate
indeterminate enhancing foci in the UPPER OUTER QUADRANT of the
RIGHT breast.

The barbell shaped tissue marking clip is appropriately position at
the site of the biopsied focus in the UPPER OUTER QUADRANT at MIDDLE
to POSTERIOR depth.

The cylinder shaped tissue marker clip is appropriately positioned
at the site of the biopsied focus in the UPPER OUTER QUADRANT at
POSTERIOR depth.

The clips are separated by approximately 1.7 cm.

Expected post biopsy changes are present at both sites without
evidence of hematoma.
IMPRESSION: 1. Appropriate positioning of the barbell shaped tissue marking clip
at the site of the biopsy focus in the UPPER OUTER QUADRANT of the
RIGHT breast at MIDDLE to POSTERIOR depth.
2. Appropriate positioning of the cylinder shaped tissue marking
clip at the site of the biopsied focus in the UPPER OUTER QUADRANT
of the RIGHT breast at POSTERIOR depth.
3. The clips are approximately 1.7 cm apart.

Final Assessment: Post Procedure Mammograms for Marker Placement

## 2020-06-20 MED ORDER — GADOBUTROL 1 MMOL/ML IV SOLN
8.0000 mL | Freq: Once | INTRAVENOUS | Status: AC | PRN
  Administered 2020-06-20: 8 mL via INTRAVENOUS

## 2020-06-21 HISTORY — PX: BREAST BIOPSY: SHX20

## 2020-06-23 ENCOUNTER — Other Ambulatory Visit: Payer: BC Managed Care – PPO

## 2020-07-06 NOTE — Progress Notes (Signed)
Your patient 

## 2020-08-19 ENCOUNTER — Other Ambulatory Visit
Admission: RE | Admit: 2020-08-19 | Discharge: 2020-08-19 | Disposition: A | Discharge status: Home / Self Care | Payer: BC Managed Care – PPO | Source: Ambulatory Visit | Attending: Gastroenterology | Admitting: Gastroenterology

## 2020-08-19 ENCOUNTER — Telehealth: Payer: Self-pay

## 2020-08-19 ENCOUNTER — Other Ambulatory Visit: Payer: Self-pay

## 2020-08-19 ENCOUNTER — Ambulatory Visit (INDEPENDENT_AMBULATORY_CARE_PROVIDER_SITE_OTHER): Payer: BC Managed Care – PPO | Admitting: Gastroenterology

## 2020-08-19 ENCOUNTER — Encounter: Payer: Self-pay | Admitting: Gastroenterology

## 2020-08-19 VITALS — BP 136/87 | HR 84 | Temp 97.3°F | Wt 186.4 lb

## 2020-08-19 DIAGNOSIS — R1013 Epigastric pain: Secondary | ICD-10-CM

## 2020-08-19 LAB — HEPATIC FUNCTION PANEL
ALT: 22 U/L (ref 0–44)
AST: 16 U/L (ref 15–41)
Albumin: 4.5 g/dL (ref 3.5–5.0)
Alkaline Phosphatase: 60 U/L (ref 38–126)
Bilirubin, Direct: <0.1 mg/dL (ref 0.0–0.2)
Total Bilirubin: 0.9 mg/dL (ref 0.3–1.2)
Total Protein: 7.4 g/dL (ref 6.5–8.1)

## 2020-08-19 MED ORDER — SUCRALFATE 1 GM/10ML PO SUSP
1.0000 g | Freq: Four times a day (QID) | ORAL | 0 refills | Status: DC
Start: 2020-08-19 — End: 2021-01-19

## 2020-08-19 MED ORDER — OMEPRAZOLE 40 MG PO CPDR
40.0000 mg | DELAYED_RELEASE_CAPSULE | Freq: Two times a day (BID) | ORAL | 0 refills | Status: DC
Start: 2020-08-19 — End: 2020-09-12

## 2020-08-19 NOTE — Progress Notes (Signed)
Kayla Darby, MD [address]  [place]  [place],  [address]  Main: [phone]  Fax: [phone]    Gastroenterology Consultation  Referring Provider:     Will Bonnet, MD Primary Care Physician:  Will Bonnet, MD Primary Gastroenterologist:  Dr. Cephas West Reason for Consultation: Epigastric pain        HPI:   Kayla West is a 44 y.o. female referred by Dr. Glennon Mac, Estill Bamberg, MD  for consultation & management of borborygmi.  Patient started to experience symptoms of gurgling sounds in her abdomen since October last year associated with some abdominal cramps and fecal urgency.  She works as a Counselling psychologist at [employer].  She moved from [place] and in the midst of pandemic, she was worried about her job security and she thinks her symptoms were situational secondary to anxiety.  She noticed that she is no longer experiencing the above symptoms as her job is stable and she is enjoying her work.  She also noticed trace/scant amount of blood on wiping during that episode.  She is no longer experiencing rectal bleeding.  She denies weight loss, nausea or vomiting.  She does experience mild abdominal bloating when she is anxious.  She denies any particular relation to food.  She was referred by her OB/GYN for further evaluation of her GI symptoms.  She had mild anemia during her pregnancy in 2019.  Follow-up visit 08/19/2020 Patient made an urgent follow-up visit because she has been experiencing 1 and 1/2 weeks of epigastric burning pain, spreading across her upper abdomen associated with retrosternal burning pain.  No particular relation to food, more or less constant associated with feeling full easily, belching/burping, gurgling in her stomach.  She denies vomiting, melena, heartburn.  She thinks stress might be making her symptoms worse but not the cause.  She just started taking Pepcid and Tums yesterday.  She has been intentionally losing  weight.  She also reports experiencing burning in her stomach at night, whenever she lays down  NSAIDs: None  Antiplts/Anticoagulants/Anti thrombotics: None  GI Procedures: None She denies family history of GI malignancy Patient does not smoke or drink alcohol  Past Medical History:  Diagnosis Date  . Abnormal TSH 06/07/2017   3.8 on NOB labs and started on Synthroid. Recheck late August.  . BRCA negative 04/2020   MyRisk neg  . 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 Endometrial polyp 12/08/2019  . Family history of breast cancer   . Family history of pancreatic cancer   . GERD (gastroesophageal reflux disease)   . History of pre-eclampsia 07/01/2017  . Increased risk of breast cancer 04/2020   IBIS=24.9%/riskscore=16.5%  . Positive ANA (antinuclear antibody) 02/22/2014   Sees Rheumatology, no meds    Past Surgical History:  Procedure Laterality Date  . BREAST LUMPECTOMY Left    benign indication  . CESAREAN SECTION     2019, 2015  . DILATATION & CURETTAGE/HYSTEROSCOPY WITH MYOSURE N/A 12/08/2019   Procedure: DILATATION & CURETTAGE/HYSTEROSCOPY WITH MYOSURE POLYPECOMTY;  Surgeon: Will Bonnet, MD;  Location: [facility] ORS;  Service: Gynecology;  Laterality: N/A;    Current Outpatient Medications:  Marland Kitchen  Multiple Vitamin (MULTIVITAMIN WITH MINERALS) TABS tablet, Take 1 tablet by mouth daily., Disp: , Rfl:  .  omeprazole (PRILOSEC) 40 MG capsule, Take 1 capsule (40 mg total) by mouth 2 (two) times daily before a meal., Disp: 60 capsule, Rfl: 0 .  sucralfate (CARAFATE) 1 GM/10ML suspension,  Take 10 mLs (1 g total) by mouth 4 (four) times daily., Disp: 420 mL, Rfl: 0   Family History  Problem Relation Age of Onset  . Pancreatic cancer Mother 88  . Breast cancer Maternal Aunt        twice-50s and 77s  . Breast cancer Maternal Grandmother        twice- 53s and 52s  . Breast cancer Maternal Aunt 77     Social History   Tobacco Use  . Smoking status: Never Smoker  .  Smokeless tobacco: Never Used  Vaping Use  . Vaping Use: Never used  Substance Use Topics  . Alcohol use: Not Currently    Comment: occassion  . Drug use: Never    Allergies as of 08/19/2020  . (No Known Allergies)    Review of Systems:    All systems reviewed and negative except where noted in HPI.   Physical Exam:  BP 136/87 (BP Location: Left Arm, Patient Position: Sitting, Cuff Size: Normal)   Pulse 84   Temp (!) 97.3 F (36.3 C) (Oral)   Wt 186 lb 6 oz (84.5 kg)   BMI 34.09 kg/m  No LMP recorded.  General:   Alert,  Well-developed, well-nourished, pleasant and cooperative in NAD Head:  Normocephalic and atraumatic. Eyes:  Sclera clear, no icterus.   Conjunctiva pink. Ears:  Normal auditory acuity. Nose:  No deformity, discharge, or lesions. Mouth:  No deformity or lesions,oropharynx pink & moist. Neck:  Supple; no masses or thyromegaly. Lungs:  Respirations even and unlabored.  Clear throughout to auscultation.   No wheezes, crackles, or rhonchi. No acute distress. Heart:  Regular rate and rhythm; no murmurs, clicks, rubs, or gallops. Abdomen:  Normal bowel sounds. Soft, non-tender and non-distended without masses, hepatosplenomegaly or hernias noted.  No guarding or rebound tenderness.   Rectal: Not performed Msk:  Symmetrical without gross deformities. Good, equal movement & strength bilaterally. Pulses:  Normal pulses noted. Extremities:  No clubbing or edema.  No cyanosis. Neurologic:  Alert and oriented x3;  grossly normal neurologically. Skin:  Intact without significant lesions or rashes. No jaundice. Psych:  Alert and cooperative. Normal mood and affect.  Imaging Studies: No abdominal imaging  Assessment and Plan:   Kayla West is a 44 y.o. female with no significant past medical history is seen in consultation of 1 and half weeks history of symptoms of dyspepsia  Recommend H. pylori breath test Check LFTs Start Pepcid 40 mg p.o. twice  daily Trial of sucralfate suspension 4 times a day If her symptoms are persistent and H. pylori negative, recommend right upper quadrant ultrasound to evaluate for gallbladder etiology.  If this is negative, recommend upper endoscopy She will contact me via MyChart  Follow up as needed   Kayla Darby, MD

## 2020-08-19 NOTE — Telephone Encounter (Signed)
Did PA on the Carafate suspension on cover my meds. Medication was approved through patient insurance company

## 2020-08-21 LAB — H. PYLORI BREATH TEST: H. pylori UBiT: NEGATIVE

## 2020-09-10 ENCOUNTER — Other Ambulatory Visit: Payer: Self-pay | Admitting: Gastroenterology

## 2020-09-12 NOTE — Telephone Encounter (Signed)
Last office visit 08/19/2020 Dyspepsia

## 2020-09-13 ENCOUNTER — Encounter: Payer: Self-pay | Admitting: Gastroenterology

## 2020-09-13 DIAGNOSIS — G8929 Other chronic pain: Secondary | ICD-10-CM

## 2020-09-13 NOTE — Telephone Encounter (Addendum)
Your RUQ ultrasound is scheduled for 09/21/2020  At 8:45am at [facility]. Please checkin at 8:30am. Nothing to eat or drink after midnight. Called and left a message Sent mychart message

## 2020-09-20 ENCOUNTER — Other Ambulatory Visit: Payer: Self-pay

## 2020-09-20 ENCOUNTER — Ambulatory Visit
Admission: EM | Admit: 2020-09-20 | Discharge: 2020-09-20 | Disposition: A | Discharge status: Home / Self Care | Payer: BC Managed Care – PPO | Attending: Emergency Medicine | Admitting: Emergency Medicine

## 2020-09-20 DIAGNOSIS — L03113 Cellulitis of right upper limb: Secondary | ICD-10-CM | POA: Diagnosis not present

## 2020-09-20 DIAGNOSIS — L03116 Cellulitis of left lower limb: Secondary | ICD-10-CM

## 2020-09-20 DIAGNOSIS — T63421A Toxic effect of venom of ants, accidental (unintentional), initial encounter: Secondary | ICD-10-CM

## 2020-09-20 MED ORDER — CEPHALEXIN 500 MG PO CAPS
500.0000 mg | ORAL_CAPSULE | Freq: Four times a day (QID) | ORAL | 0 refills | Status: DC
Start: 2020-09-20 — End: 2020-10-12

## 2020-09-20 MED ORDER — PREDNISONE 10 MG (21) PO TBPK
ORAL_TABLET | Freq: Every day | ORAL | 0 refills | Status: DC
Start: 2020-09-20 — End: 2020-09-26

## 2020-09-20 NOTE — ED Triage Notes (Signed)
Pt is here with fire ants bits that are on her left thigh and right forearm that started yesterday. Pt has taken Benadryl to relieve discomfort.

## 2020-09-20 NOTE — Discharge Instructions (Signed)
Take the cephalexin and prednisone as directed.  Continue taking Benadryl every 6 hours.  Do not drive, operate machinery, or drink alcohol with Benadryl as it may cause drowsiness.  If you need to be alert, take Claritin or Allegra during the day and Benadryl at night.    Go to the emergency department if you have signs of worsening infection such as increased redness, red streaks, fever, or other concerning symptoms.

## 2020-09-20 NOTE — ED Provider Notes (Signed)
Roderic Palau    CSN: [id number] Arrival date & time: 09/20/20  1045      History   Chief Complaint Chief Complaint  Patient presents with  . Insect Bite    HPI Kayla West is a 44 y.o. female.   Patient presents with redness, warmth, swelling on her left thigh and right forearm x1 day after being bitten by fire ants.  Treatment attempted with Benadryl at home last night; none taken today.  She denies fever, chills, numbness, weakness, paresthesias, chest pain, shortness of breath, abdominal pain, or other symptoms.  Her medical history includes GERD, endometrial polyp, abnormal TSH, positive ANA.  The history is provided by the patient and medical records.    Past Medical History:  Diagnosis Date  . Abnormal TSH 06/07/2017   3.8 on NOB labs and started on Synthroid. Recheck late August.  . BRCA negative 04/2020   MyRisk neg  . 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 Endometrial polyp 12/08/2019  . Family history of breast cancer   . Family history of pancreatic cancer   . GERD (gastroesophageal reflux disease)   . History of pre-eclampsia 07/01/2017  . Increased risk of breast cancer 04/2020   IBIS=24.9%/riskscore=16.5%  . Positive ANA (antinuclear antibody) 02/22/2014   Sees Rheumatology, no meds    Patient Active Problem List   Diagnosis Date Noted  . Joint pain 03/23/2013  . Malaise and fatigue 03/23/2013    Past Surgical History:  Procedure Laterality Date  . BREAST LUMPECTOMY Left    benign indication  . CESAREAN SECTION     2019, 2015  . DILATATION & CURETTAGE/HYSTEROSCOPY WITH MYOSURE N/A 12/08/2019   Procedure: DILATATION & CURETTAGE/HYSTEROSCOPY WITH MYOSURE POLYPECOMTY;  Surgeon: Will Bonnet, MD;  Location: [facility] ORS;  Service: Gynecology;  Laterality: N/A;    OB History    Gravida  3   Para  2   Term  2   Preterm      AB  1   Living  3     SAB  1   TAB      Ectopic      Multiple  1   Live Births  3             Home Medications    Prior to Admission medications   Medication Sig Start Date End Date Taking? Authorizing Provider  cephALEXin (KEFLEX) 500 MG capsule Take 1 capsule (500 mg total) by mouth 4 (four) times daily. 09/20/20   Sharion Balloon, NP  Multiple Vitamin (MULTIVITAMIN WITH MINERALS) TABS tablet Take 1 tablet by mouth daily.    [provider]  omeprazole (PRILOSEC) 40 MG capsule TAKE 1 CAPSULE (40 MG TOTAL) BY MOUTH 2 (TWO) TIMES DAILY BEFORE A MEAL. 09/12/20 10/12/20  Lin Landsman, MD  predniSONE (STERAPRED UNI-PAK 21 TAB) 10 MG (21) TBPK tablet Take by mouth daily. As directed 09/20/20   Sharion Balloon, NP  sucralfate (CARAFATE) 1 GM/10ML suspension Take 10 mLs (1 g total) by mouth 4 (four) times daily. 08/19/20   Lin Landsman, MD    Family History Family History  Problem Relation Age of Onset  . Pancreatic cancer Mother 3  . Breast cancer Maternal Aunt        twice-50s and 55s  . Breast cancer Maternal Grandmother        twice- 83s and 55s  . Breast cancer Maternal Aunt 77    Social History Social History  Tobacco Use  . Smoking status: Never Smoker  . Smokeless tobacco: Never Used  Vaping Use  . Vaping Use: Never used  Substance Use Topics  . Alcohol use: Not Currently    Comment: occassion  . Drug use: Never     Allergies   Patient has no known allergies.   Review of Systems Review of Systems  Constitutional: Negative for chills and fever.  HENT: Negative for ear pain and sore throat.   Eyes: Negative for pain and visual disturbance.  Respiratory: Negative for cough and shortness of breath.   Cardiovascular: Negative for chest pain and palpitations.  Gastrointestinal: Negative for abdominal pain and vomiting.  Genitourinary: Negative for dysuria and hematuria.  Musculoskeletal: Negative for arthralgias and back pain.  Skin: Positive for color change and wound.  Neurological: Negative for seizures, syncope, weakness and  numbness.  All other systems reviewed and are negative.    Physical Exam Triage Vital Signs ED Triage Vitals  Enc Vitals Group     BP      Pulse      Resp      Temp      Temp src      SpO2      Weight      Height      Head Circumference      Peak Flow      Pain Score      Pain Loc      Pain Edu?      Excl. in [place]?    No data found.  Updated Vital Signs BP 117/79 (BP Location: Left Arm)   Pulse 84   Temp 98.7 F (37.1 C) (Oral)   Resp 18   LMP 09/06/2020   SpO2 98%   Visual Acuity Right Eye Distance:   Left Eye Distance:   Bilateral Distance:    Right Eye Near:   Left Eye Near:    Bilateral Near:     Physical Exam Vitals and nursing note reviewed.  Constitutional:      General: She is not in acute distress.    Appearance: She is well-developed. She is not ill-appearing.  HENT:     Head: Normocephalic and atraumatic.     Mouth/Throat:     Mouth: Mucous membranes are moist.     Pharynx: Oropharynx is clear.     Comments: No oropharyngeal swelling.  Speech clear, no difficulty swallowing. Eyes:     Conjunctiva/sclera: Conjunctivae normal.  Cardiovascular:     Rate and Rhythm: Normal rate and regular rhythm.     Heart sounds: Normal heart sounds.  Pulmonary:     Effort: Pulmonary effort is normal. No respiratory distress.     Breath sounds: Normal breath sounds. No stridor. No wheezing, rhonchi or rales.  Abdominal:     Palpations: Abdomen is soft.     Tenderness: There is no abdominal tenderness.  Musculoskeletal:        General: Normal range of motion.     Cervical back: Neck supple.  Skin:    General: Skin is warm and dry.     Capillary Refill: Capillary refill takes less than 2 seconds.     Findings: Erythema and lesion present.     Comments: Left thigh: 19 x 15 cm area of erythema with central vesicle.  Right forearm: 16 x 14 cm area of edema erythema with central vesicle.  Neurological:     General: No focal deficit present.     Mental  Status:  She is alert and oriented to person, place, and time.     Sensory: No sensory deficit.     Motor: No weakness.     Gait: Gait normal.  Psychiatric:        Mood and Affect: Mood normal.        Behavior: Behavior normal.          UC Treatments / Results  Labs (all labs ordered are listed, but only abnormal results are displayed) Labs Reviewed - No data to display  EKG   Radiology No results found.  Procedures Procedures (including critical care time)  Medications Ordered in UC Medications - No data to display  Initial Impression / Assessment and Plan / UC Course  I have reviewed the triage vital signs and the nursing notes.  Pertinent labs & imaging results that were available during my care of the patient were reviewed by me and considered in my medical decision making (see chart for details).   Cellulitis of left thigh and cellulitis of right forearm due to fire ant bites.  No oropharyngeal swelling or respiratory distress.  Treating with cephalexin, prednisone taper, Benadryl.  Precautions for drowsiness with Benadryl discussed with patient.  Instructed her to take Claritin or Allegra during the day if she needs to be alert.  Instructed her to go to the ED if she has signs of worsening infection.  Patient agrees to plan of care.   Final Clinical Impressions(s) / UC Diagnoses   Final diagnoses:  Cellulitis of left thigh  Cellulitis of right forearm  Fire ant bite, accidental or unintentional, initial encounter     Discharge Instructions     Take the cephalexin and prednisone as directed.  Continue taking Benadryl every 6 hours.  Do not drive, operate machinery, or drink alcohol with Benadryl as it may cause drowsiness.  If you need to be alert, take Claritin or Allegra during the day and Benadryl at night.    Go to the emergency department if you have signs of worsening infection such as increased redness, red streaks, fever, or other concerning symptoms.         ED Prescriptions    Medication Sig Dispense Auth. Provider   cephALEXin (KEFLEX) 500 MG capsule Take 1 capsule (500 mg total) by mouth 4 (four) times daily. 28 capsule Barkley Boards H, NP   predniSONE (STERAPRED UNI-PAK 21 TAB) 10 MG (21) TBPK tablet Take by mouth daily. As directed 21 tablet Sharion Balloon, NP     PDMP not reviewed this encounter.   Sharion Balloon, NP 09/20/20 1128

## 2020-09-21 ENCOUNTER — Ambulatory Visit
Admission: RE | Admit: 2020-09-21 | Discharge: 2020-09-21 | Disposition: A | Discharge status: Home / Self Care | Payer: BC Managed Care – PPO | Source: Ambulatory Visit | Attending: Gastroenterology | Admitting: Gastroenterology

## 2020-09-21 DIAGNOSIS — G8929 Other chronic pain: Secondary | ICD-10-CM | POA: Diagnosis not present

## 2020-09-21 DIAGNOSIS — K8020 Calculus of gallbladder without cholecystitis without obstruction: Secondary | ICD-10-CM | POA: Diagnosis not present

## 2020-09-21 DIAGNOSIS — R1013 Epigastric pain: Secondary | ICD-10-CM | POA: Diagnosis not present

## 2020-09-21 IMAGING — US US ABDOMEN LIMITED
1 series · 14 of 25 positions shown · non-contrast
Comparison: None.

CLINICAL DATA: Epigastric pain for several months.

EXAM:
ULTRASOUND ABDOMEN LIMITED RIGHT UPPER QUADRANT

[Series 1: us abdomen limited · 0.22mm/px · 14 of 54 slices shown]
[im 1/54]
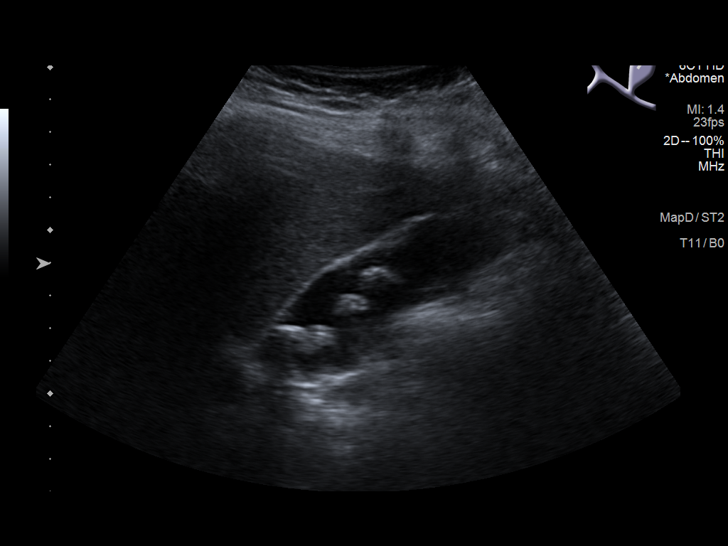
[im 5/54]
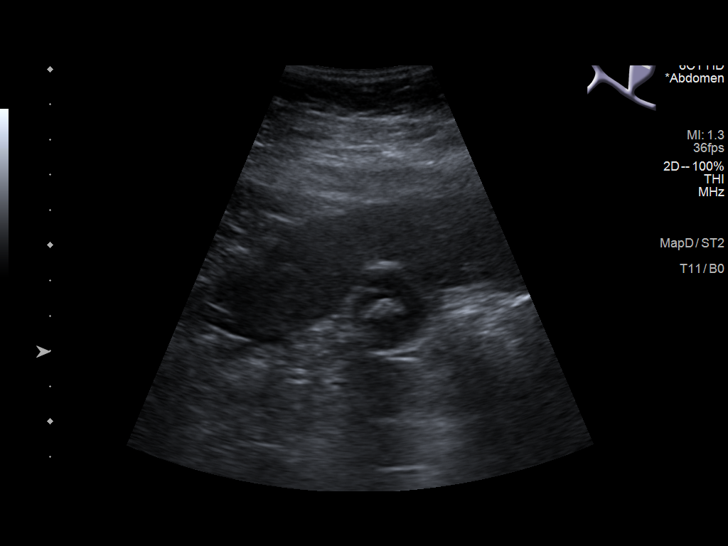
[im 9/54]
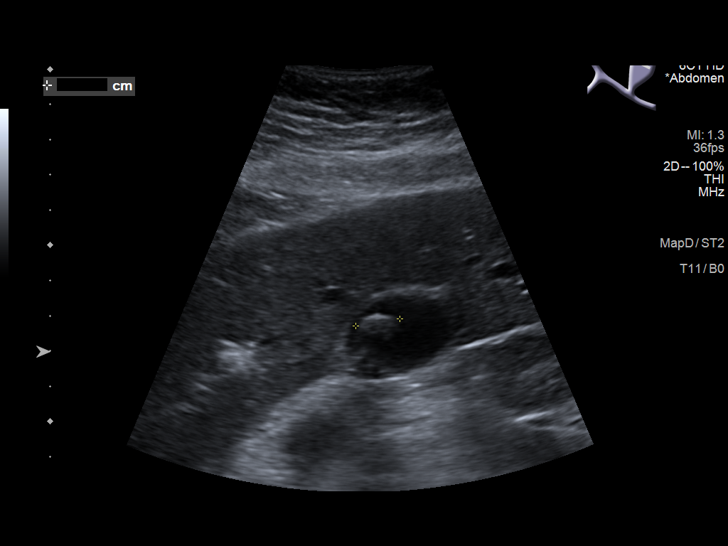
[im 14/54]
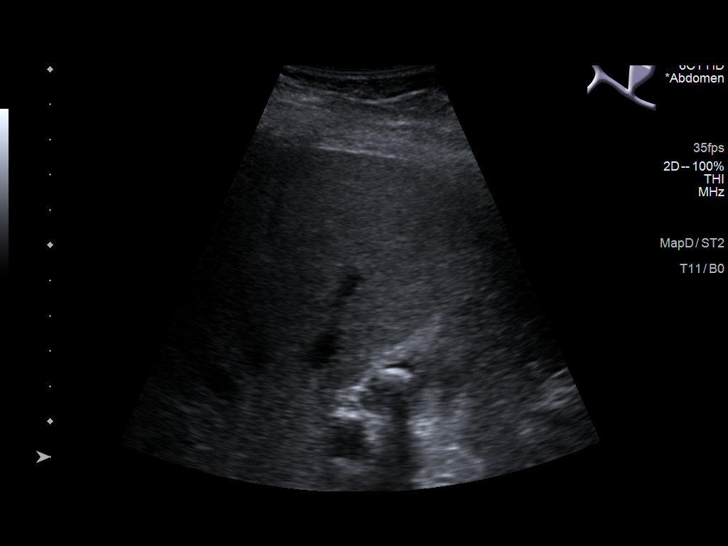
[im 18/54]
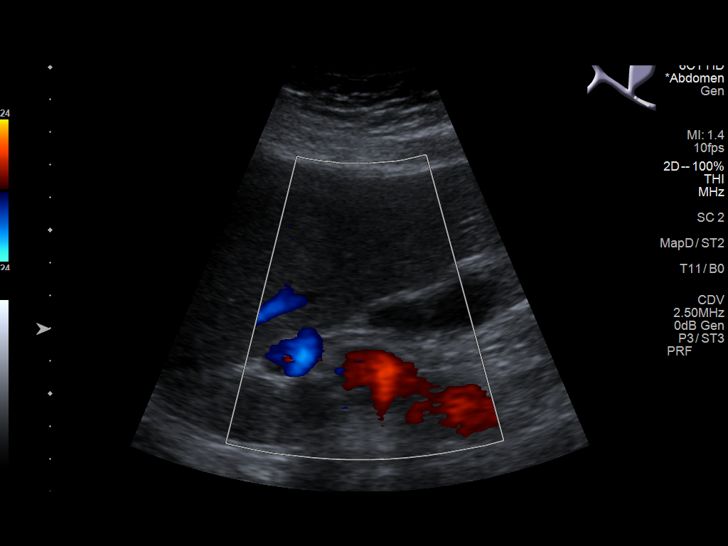
[im 20/54]
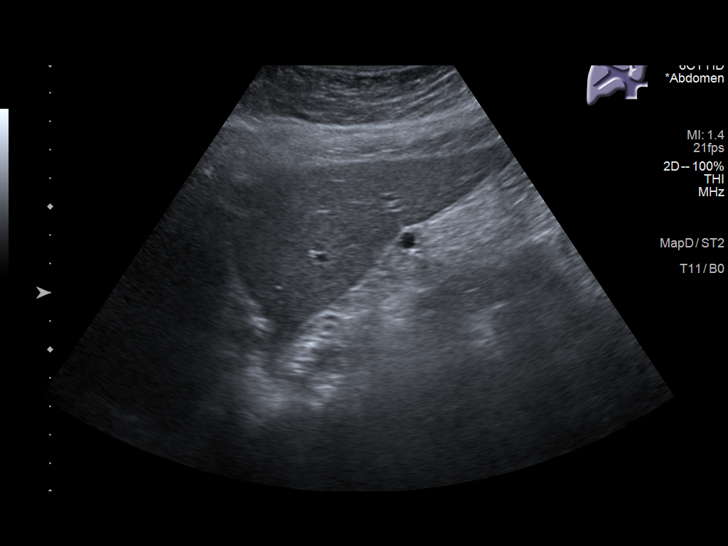
[im 25/54]
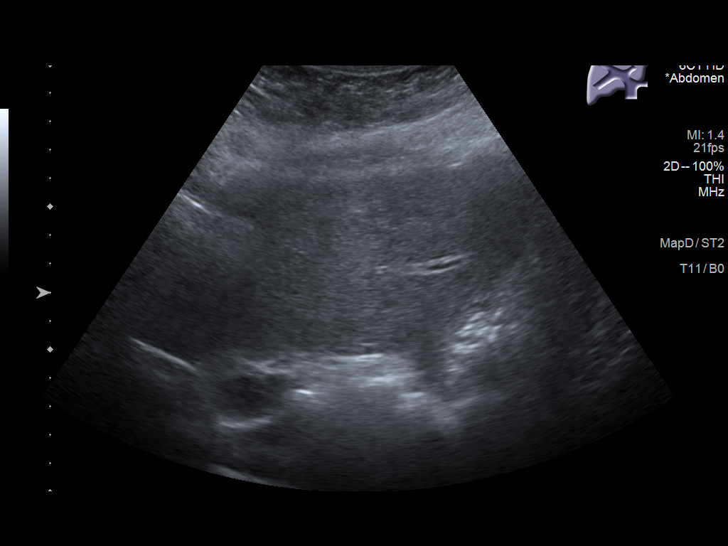
[im 29/54]
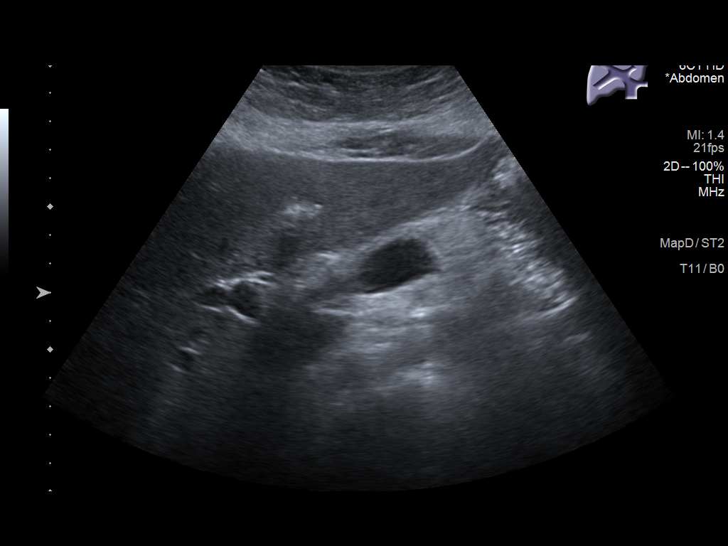
[im 34/54]
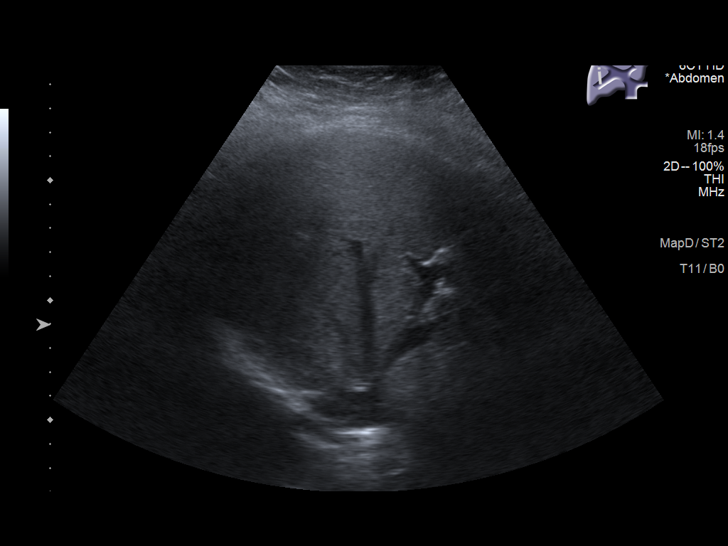
[im 36/54]
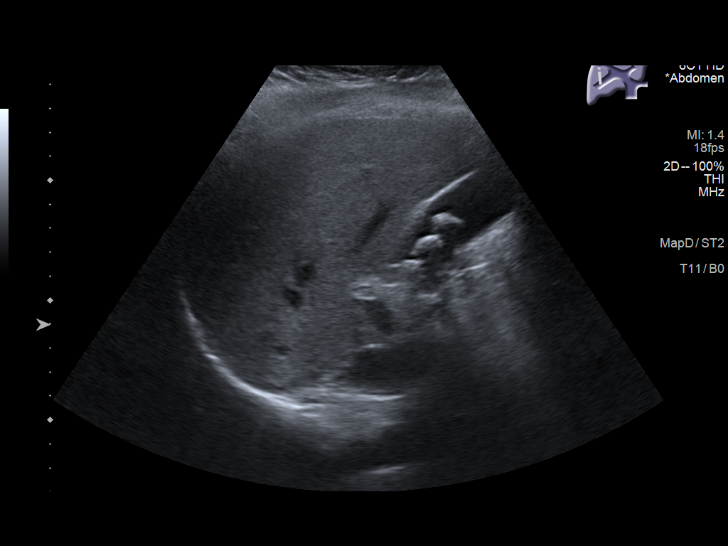
[im 40/54]
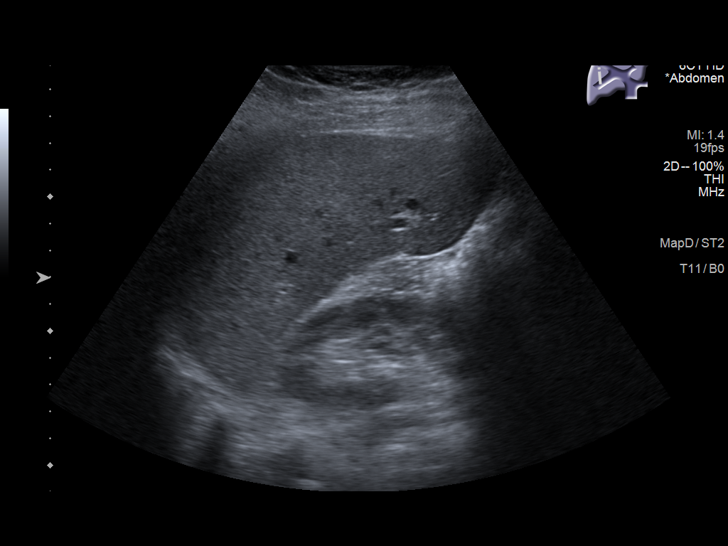
[im 45/54]
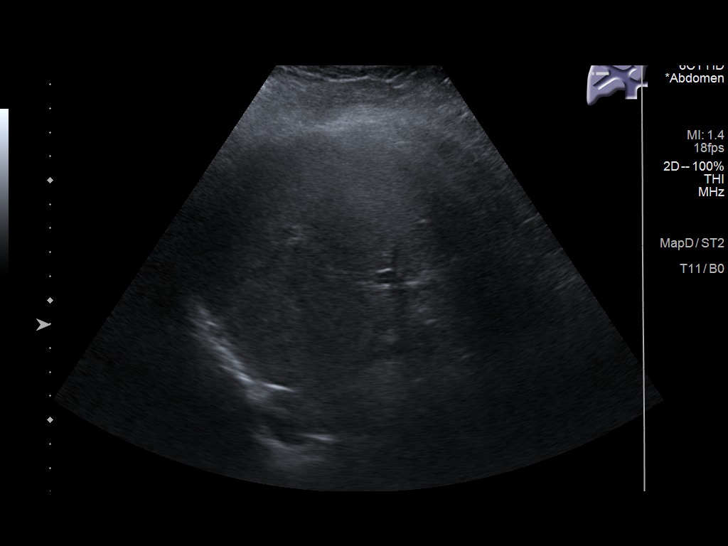
[im 49/54]
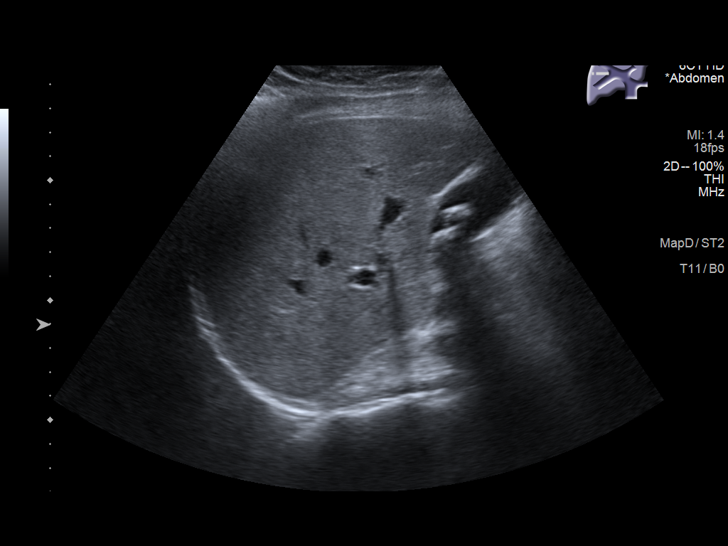
[im 54/54]
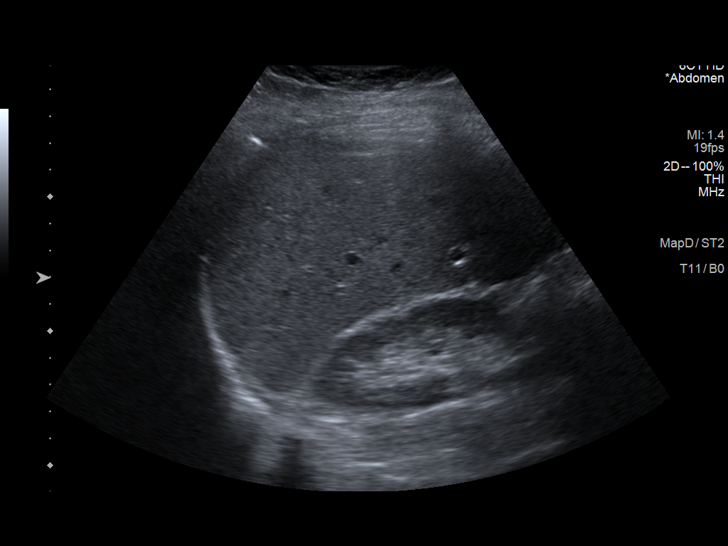

[14 of 25 positions shown; findings below may reference images not displayed]

FINDINGS: Gallbladder:

Multiple shadowing gallstones measuring up to 1.3 cm. No gallbladder
wall thickening negative sonographic Murphy sign.

Common bile duct:

Diameter: 0.2 cm, within normal limits.

Liver:

No focal lesion identified. Within normal limits in parenchymal
echogenicity. Portal vein is patent on color Doppler imaging with
normal direction of blood flow towards the liver.

Other: None.
IMPRESSION: Cholelithiasis without evidence of acute cholecystitis.

## 2020-09-22 ENCOUNTER — Encounter: Payer: Self-pay | Admitting: Gastroenterology

## 2020-09-22 ENCOUNTER — Telehealth: Payer: Self-pay

## 2020-09-22 DIAGNOSIS — G8929 Other chronic pain: Secondary | ICD-10-CM

## 2020-09-22 DIAGNOSIS — K8060 Calculus of gallbladder and bile duct with cholecystitis, unspecified, without obstruction: Secondary | ICD-10-CM

## 2020-09-22 NOTE — Telephone Encounter (Signed)
-----   Message from Toney Reil, MD sent at 09/22/2020 11:20 AM EDT ----- Symptomatic cholelithiasis, epigastric pain, refer to general surgery  RV

## 2020-09-22 NOTE — Telephone Encounter (Signed)
Placed referral to general surgeon. 

## 2020-09-26 ENCOUNTER — Ambulatory Visit: Payer: BC Managed Care – PPO | Admitting: Surgery

## 2020-09-26 ENCOUNTER — Encounter: Payer: Self-pay | Admitting: Surgery

## 2020-09-26 ENCOUNTER — Other Ambulatory Visit: Payer: Self-pay

## 2020-09-26 ENCOUNTER — Telehealth: Payer: Self-pay | Admitting: Surgery

## 2020-09-26 VITALS — BP 130/86 | HR 76 | Temp 98.7°F | Resp 12 | Ht 62.0 in | Wt 190.0 lb

## 2020-09-26 DIAGNOSIS — K8020 Calculus of gallbladder without cholecystitis without obstruction: Secondary | ICD-10-CM | POA: Diagnosis not present

## 2020-09-26 NOTE — H&P (View-Only) (Signed)
09/26/2020  Reason for Visit:  Symptomatic cholelithiasis  Referring Provider:  Sherri Sear, MD  History of Present Illness: Kayla West is a 44 y.o. female presenting for evaluation of symptomatic cholelithiasis.  The patient has been seeing Dr. Marius Ditch for abdominal pain associated with nausea.  The patient reports that the pain is more like a dull ache in the epigastric area, radiating towards the back between the shoulder blades.  Is associated with some nausea but with no vomiting.  The discomfort has started after meals but is not always with meals and sometimes can be in the middle of the night.  It goes away on its own after short period of time.  She has not had any episodes where she has had to come to the hospital because of the pain.  Denies any fevers, chills, chest pain, shortness of breath.  After seeing Dr. Marius Ditch, she has tried both Prilosec twice daily and Carafate as needed.  She has noticed some mild improvement in symptoms but the bulk of her symptoms are still persisting.  She had an ultrasound of the right upper quadrant on 09/22/2020 which showed cholelithiasis with multiple gallstones measuring up to 1.3 cm but with a normal sized common bile duct.  In light of this, she was referred to [place] for further evaluation and cholecystectomy.  Past Medical History: Past Medical History:  Diagnosis Date  . Abnormal TSH 06/07/2017   3.8 on NOB labs and started on Synthroid. Recheck late August.  . BRCA negative 04/2020   MyRisk neg  . 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 Endometrial polyp 12/08/2019  . Family history of breast cancer   . Family history of pancreatic cancer   . GERD (gastroesophageal reflux disease)   . History of pre-eclampsia 07/01/2017  . Increased risk of breast cancer 04/2020   IBIS=24.9%/riskscore=16.5%  . Positive ANA (antinuclear antibody) 02/22/2014   Sees Rheumatology, no meds     Past Surgical History: Past Surgical History:  Procedure  Laterality Date  . BREAST LUMPECTOMY Left    benign indication  . CESAREAN SECTION     2019, 2015  . DILATATION & CURETTAGE/HYSTEROSCOPY WITH MYOSURE N/A 12/08/2019   Procedure: DILATATION & CURETTAGE/HYSTEROSCOPY WITH MYOSURE POLYPECOMTY;  Surgeon: Will Bonnet, MD;  Location: [facility] ORS;  Service: Gynecology;  Laterality: N/A;    Home Medications: Prior to Admission medications   Medication Sig Start Date End Date Taking? Authorizing Provider  cephALEXin (KEFLEX) 500 MG capsule Take 1 capsule (500 mg total) by mouth 4 (four) times daily. 09/20/20  Yes Sharion Balloon, NP  Multiple Vitamin (MULTIVITAMIN WITH MINERALS) TABS tablet Take 1 tablet by mouth daily.   Yes [provider]  omeprazole (PRILOSEC) 40 MG capsule TAKE 1 CAPSULE (40 MG TOTAL) BY MOUTH 2 (TWO) TIMES DAILY BEFORE A MEAL. 09/12/20 10/12/20 Yes Vanga, Tally Due, MD  sucralfate (CARAFATE) 1 GM/10ML suspension Take 10 mLs (1 g total) by mouth 4 (four) times daily. 08/19/20  Yes Vanga, Tally Due, MD    Allergies: No Known Allergies  Social History:  reports that she has never smoked. She has never used smokeless tobacco. She reports previous alcohol use. She reports that she does not use drugs.   Family History: Family History  Problem Relation Age of Onset  . Pancreatic cancer Mother 72  . Breast cancer Maternal Aunt        twice-50s and 17s  . Breast cancer Maternal Grandmother  twice- 15s and 69s  . Breast cancer Maternal Aunt 77    Review of Systems: Review of Systems  Constitutional: Negative for chills and fever.  HENT: Negative for hearing loss.   Eyes: Negative for blurred vision.  Respiratory: Negative for shortness of breath.   Cardiovascular: Negative for chest pain.  Gastrointestinal: Positive for abdominal pain and nausea. Negative for constipation, diarrhea and vomiting.  Genitourinary: Negative for dysuria.  Musculoskeletal: Positive for back pain. Negative for myalgias.   Skin: Negative for rash.  Neurological: Negative for dizziness.  Psychiatric/Behavioral: Negative for depression.    Physical Exam BP 130/86   Pulse 76   Temp 98.7 F (37.1 C) (Oral)   Resp 12   Ht _0  (1.575 m)   Wt 190 lb (86.2 kg)   LMP 09/06/2020   SpO2 99%   BMI 34.75 kg/m  CONSTITUTIONAL: No acute distress HEENT:  Normocephalic, atraumatic, extraocular motion intact. NECK: Trachea is midline, and there is no jugular venous distension.  RESPIRATORY:  Normal respiratory effort without pathologic use of accessory muscles. CARDIOVASCULAR: Regular rhythm and rate. GI: The abdomen is soft, nondistended, currently nontender to palpation.  Negative Murphy's sign.   MUSCULOSKELETAL:  Normal muscle strength and tone in all four extremities.  No peripheral edema or cyanosis. SKIN: Skin turgor is normal. There are no pathologic skin lesions.  NEUROLOGIC:  Motor and sensation is grossly normal.  Cranial nerves are grossly intact. PSYCH:  Alert and oriented to person, place and time. Affect is normal.  Laboratory Analysis: Labs from 08/19/2020: Total bilirubin 0.9, AST 16, ALT 22, alkaline phosphatase 60.  Imaging: Ultrasound right upper quadrant on 09/21/20: IMPRESSION: Cholelithiasis without evidence of acute cholecystitis.  Assessment and Plan: This is a 44 y.o. female presenting with symptomatic cholelithiasis with no sniffing and improvement of symptoms after trying Prilosec and Carafate.  -Discussed with the patient some of the typical findings and symptoms with cholelithiasis and biliary colic.  Although she does need some of the criteria I agree with her that I would have also started a trial of Prilosec first prior to going directly to her surgery.  Now that she has tried this and although there has been some mild improvement she still has symptoms overall and so I think most likely her discomfort is related to her gallbladder.  Discussed with her that we can offer her a  minimally invasive robotic cholecystectomy.  Discussed with her the surgery at length including the risks of bleeding, infection, injury to surrounding structures.  Reviewed with her postoperative activity restrictions and she is willing to proceed.  She understands that she will need a COVID-19 test prior to surgery. -Recommended that she continue taking her Prilosec in the meantime as this has improved some of her symptoms. -Patient will be scheduled for either 11/23 or 11/24 depending on OR availability.  Face-to-face time spent with the patient and care providers was 60 minutes, with more than 50% of the time spent counseling, educating, and coordinating care of the patient.     Melvyn Neth, [facility]

## 2020-09-26 NOTE — Patient Instructions (Signed)
Our surgery scheduler will call to schedule your surgery within 24/48 hours. Please have the Blue surgery sheet available when speaking with her.   Cholelithiasis  Cholelithiasis is also called "gallstones." It is a kind of gallbladder disease. The gallbladder is an organ that stores a liquid (bile) that helps you digest fat. Gallstones may not cause symptoms (may be silent gallstones) until they cause a blockage, and then they can cause pain (gallbladder attack). Follow these instructions at home:  Take over-the-counter and prescription medicines only as told by your doctor.  Stay at a healthy weight.  Eat healthy foods. This includes: ? Eating fewer fatty foods, like fried foods. ? Eating fewer refined carbs (refined carbohydrates). Refined carbs are breads and grains that are highly processed, like white bread and white rice. Instead, choose whole grains like whole-wheat bread and brown rice. ? Eating more fiber. Almonds, fresh fruit, and beans are healthy sources of fiber.  Keep all follow-up visits as told by your doctor. This is important. Contact a doctor if:  You have sudden pain in the upper right side of your belly (abdomen). Pain might spread to your right shoulder or your chest. This may be a sign of a gallbladder attack.  You feel sick to your stomach (are nauseous).  You throw up (vomit).  You have been diagnosed with gallstones that have no symptoms and you get: ? Belly pain. ? Discomfort, burning, or fullness in the upper part of your belly (indigestion). Get help right away if:  You have sudden pain in the upper right side of your belly, and it lasts for more than 2 hours.  You have belly pain that lasts for more than 5 hours.  You have a fever or chills.  You keep feeling sick to your stomach or you keep throwing up.  Your skin or the whites of your eyes turn yellow (jaundice).  You have dark-colored pee (urine).  You have light-colored poop  (stool). Summary  Cholelithiasis is also called "gallstones."  The gallbladder is an organ that stores a liquid (bile) that helps you digest fat.  Silent gallstones are gallstones that do not cause symptoms.  A gallbladder attack may cause sudden pain in the upper right side of your belly. Pain might spread to your right shoulder or your chest. If this happens, contact your doctor.  If you have sudden pain in the upper right side of your belly that lasts for more than 2 hours, get help right away. This information is not intended to replace advice given to you by your health care provider. Make sure you discuss any questions you have with your health care provider. Document Revised: 10/18/2017 Document Reviewed: 07/22/2016 Elsevier Patient Education  2020 [employer].

## 2020-09-26 NOTE — Telephone Encounter (Signed)
Outgoing call is made, left message for patient to call.  Please advise patient of Pre-Admission date/time, COVID Testing date and Surgery date.  Surgery Date: 10/20/20 Preadmission Testing Date: 10/12/20 (phone 8a-1p) Covid Testing Date: 10/18/20 - patient [facility] [facility]) between 8a-1p   Also patient needs to call at [phone], between 1-3:00pm the day before surgery, to find out what time to arrive for surgery.

## 2020-09-26 NOTE — Progress Notes (Signed)
09/26/2020  Reason for Visit:  Symptomatic cholelithiasis  Referring Provider:  Sherri Sear, MD  History of Present Illness: Kayla West is a 44 y.o. female presenting for evaluation of symptomatic cholelithiasis.  The patient has been seeing Dr. Marius Ditch for abdominal pain associated with nausea.  The patient reports that the pain is more like a dull ache in the epigastric area, radiating towards the back between the shoulder blades.  Is associated with some nausea but with no vomiting.  The discomfort has started after meals but is not always with meals and sometimes can be in the middle of the night.  It goes away on its own after short period of time.  She has not had any episodes where she has had to come to the hospital because of the pain.  Denies any fevers, chills, chest pain, shortness of breath.  After seeing Dr. Marius Ditch, she has tried both Prilosec twice daily and Carafate as needed.  She has noticed some mild improvement in symptoms but the bulk of her symptoms are still persisting.  She had an ultrasound of the right upper quadrant on 09/22/2020 which showed cholelithiasis with multiple gallstones measuring up to 1.3 cm but with a normal sized common bile duct.  In light of this, she was referred to [place] for further evaluation and cholecystectomy.  Past Medical History: Past Medical History:  Diagnosis Date  . Abnormal TSH 06/07/2017   3.8 on NOB labs and started on Synthroid. Recheck late August.  . BRCA negative 04/2020   MyRisk neg  . 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 Endometrial polyp 12/08/2019  . Family history of breast cancer   . Family history of pancreatic cancer   . GERD (gastroesophageal reflux disease)   . History of pre-eclampsia 07/01/2017  . Increased risk of breast cancer 04/2020   IBIS=24.9%/riskscore=16.5%  . Positive ANA (antinuclear antibody) 02/22/2014   Sees Rheumatology, no meds     Past Surgical History: Past Surgical History:  Procedure  Laterality Date  . BREAST LUMPECTOMY Left    benign indication  . CESAREAN SECTION     2019, 2015  . DILATATION & CURETTAGE/HYSTEROSCOPY WITH MYOSURE N/A 12/08/2019   Procedure: DILATATION & CURETTAGE/HYSTEROSCOPY WITH MYOSURE POLYPECOMTY;  Surgeon: Will Bonnet, MD;  Location: [facility] ORS;  Service: Gynecology;  Laterality: N/A;    Home Medications: Prior to Admission medications   Medication Sig Start Date End Date Taking? Authorizing Provider  cephALEXin (KEFLEX) 500 MG capsule Take 1 capsule (500 mg total) by mouth 4 (four) times daily. 09/20/20  Yes Sharion Balloon, NP  Multiple Vitamin (MULTIVITAMIN WITH MINERALS) TABS tablet Take 1 tablet by mouth daily.   Yes [provider]  omeprazole (PRILOSEC) 40 MG capsule TAKE 1 CAPSULE (40 MG TOTAL) BY MOUTH 2 (TWO) TIMES DAILY BEFORE A MEAL. 09/12/20 10/12/20 Yes Vanga, Tally Due, MD  sucralfate (CARAFATE) 1 GM/10ML suspension Take 10 mLs (1 g total) by mouth 4 (four) times daily. 08/19/20  Yes Vanga, Tally Due, MD    Allergies: No Known Allergies  Social History:  reports that she has never smoked. She has never used smokeless tobacco. She reports previous alcohol use. She reports that she does not use drugs.   Family History: Family History  Problem Relation Age of Onset  . Pancreatic cancer Mother 54  . Breast cancer Maternal Aunt        twice-50s and 67s  . Breast cancer Maternal Grandmother  twice- 46s and 23s  . Breast cancer Maternal Aunt 77    Review of Systems: Review of Systems  Constitutional: Negative for chills and fever.  HENT: Negative for hearing loss.   Eyes: Negative for blurred vision.  Respiratory: Negative for shortness of breath.   Cardiovascular: Negative for chest pain.  Gastrointestinal: Positive for abdominal pain and nausea. Negative for constipation, diarrhea and vomiting.  Genitourinary: Negative for dysuria.  Musculoskeletal: Positive for back pain. Negative for myalgias.   Skin: Negative for rash.  Neurological: Negative for dizziness.  Psychiatric/Behavioral: Negative for depression.    Physical Exam BP 130/86   Pulse 76   Temp 98.7 F (37.1 C) (Oral)   Resp 12   Ht _0  (1.575 m)   Wt 190 lb (86.2 kg)   LMP 09/06/2020   SpO2 99%   BMI 34.75 kg/m  CONSTITUTIONAL: No acute distress HEENT:  Normocephalic, atraumatic, extraocular motion intact. NECK: Trachea is midline, and there is no jugular venous distension.  RESPIRATORY:  Normal respiratory effort without pathologic use of accessory muscles. CARDIOVASCULAR: Regular rhythm and rate. GI: The abdomen is soft, nondistended, currently nontender to palpation.  Negative Murphy's sign.   MUSCULOSKELETAL:  Normal muscle strength and tone in all four extremities.  No peripheral edema or cyanosis. SKIN: Skin turgor is normal. There are no pathologic skin lesions.  NEUROLOGIC:  Motor and sensation is grossly normal.  Cranial nerves are grossly intact. PSYCH:  Alert and oriented to person, place and time. Affect is normal.  Laboratory Analysis: Labs from 08/19/2020: Total bilirubin 0.9, AST 16, ALT 22, alkaline phosphatase 60.  Imaging: Ultrasound right upper quadrant on 09/21/20: IMPRESSION: Cholelithiasis without evidence of acute cholecystitis.  Assessment and Plan: This is a 44 y.o. female presenting with symptomatic cholelithiasis with no sniffing and improvement of symptoms after trying Prilosec and Carafate.  -Discussed with the patient some of the typical findings and symptoms with cholelithiasis and biliary colic.  Although she does need some of the criteria I agree with her that I would have also started a trial of Prilosec first prior to going directly to her surgery.  Now that she has tried this and although there has been some mild improvement she still has symptoms overall and so I think most likely her discomfort is related to her gallbladder.  Discussed with her that we can offer her a  minimally invasive robotic cholecystectomy.  Discussed with her the surgery at length including the risks of bleeding, infection, injury to surrounding structures.  Reviewed with her postoperative activity restrictions and she is willing to proceed.  She understands that she will need a COVID-19 test prior to surgery. -Recommended that she continue taking her Prilosec in the meantime as this has improved some of her symptoms. -Patient will be scheduled for either 11/23 or 11/24 depending on OR availability.  Face-to-face time spent with the patient and care providers was 60 minutes, with more than 50% of the time spent counseling, educating, and coordinating care of the patient.     Melvyn Neth, [facility]

## 2020-09-27 NOTE — Telephone Encounter (Signed)
Incoming call from patient, she is informed of all dates regarding her surgery and voices understanding.

## 2020-10-06 DIAGNOSIS — M9902 Segmental and somatic dysfunction of thoracic region: Secondary | ICD-10-CM | POA: Diagnosis not present

## 2020-10-06 DIAGNOSIS — M9903 Segmental and somatic dysfunction of lumbar region: Secondary | ICD-10-CM | POA: Diagnosis not present

## 2020-10-06 DIAGNOSIS — M546 Pain in thoracic spine: Secondary | ICD-10-CM | POA: Diagnosis not present

## 2020-10-06 DIAGNOSIS — M5416 Radiculopathy, lumbar region: Secondary | ICD-10-CM | POA: Diagnosis not present

## 2020-10-12 ENCOUNTER — Other Ambulatory Visit: Payer: Self-pay

## 2020-10-12 ENCOUNTER — Encounter
Admission: RE | Admit: 2020-10-12 | Discharge: 2020-10-12 | Disposition: A | Discharge status: Home / Self Care | Payer: BC Managed Care – PPO | Source: Ambulatory Visit | Attending: Surgery | Admitting: Surgery

## 2020-10-12 HISTORY — DX: Other specified postprocedural states: Z98.890

## 2020-10-12 HISTORY — DX: Nausea with vomiting, unspecified: R11.2

## 2020-10-12 NOTE — Patient Instructions (Signed)
Your procedure is scheduled on: 10/20/20 Report to DAY SURGERY DEPARTMENT LOCATED ON 2ND FLOOR MEDICAL MALL ENTRANCE. To find out your arrival time please call [phone] between 1PM - 3PM on 10/19/20.  Remember: Instructions that are not followed completely may result in serious medical risk, up to and including death, or upon the discretion of your surgeon and anesthesiologist your surgery may need to be rescheduled.     _X__ 1. Do not eat food after midnight the night before your procedure.                 No gum chewing or hard candies. You may drink clear liquids up to 2 hours                 before you are scheduled to arrive for your surgery- DO not drink clear                 liquids within 2 hours of the start of your surgery.                 Clear Liquids include:  water, apple juice without pulp, clear carbohydrate                 drink such as Clearfast or Gatorade, Black Coffee or Tea (Do not add                 anything to coffee or tea). Diabetics water only  __X__2.  On the morning of surgery brush your teeth with toothpaste and water, you                 may rinse your mouth with mouthwash if you wish.  Do not swallow any              toothpaste of mouthwash.     _X__ 3.  No Alcohol for 24 hours before or after surgery.   _X__ 4.  Do Not Smoke or use e-cigarettes For 24 Hours Prior to Your Surgery.                 Do not use any chewable tobacco products for at least 6 hours prior to                 surgery.  ____  5.  Bring all medications with you on the day of surgery if instructed.   __X__  6.  Notify your doctor if there is any change in your medical condition      (cold, fever, infections).     Do not wear jewelry, make-up, hairpins, clips or nail polish. Do not wear lotions, powders, or perfumes.  Do not shave 48 hours prior to surgery. Men may shave face and neck. Do not bring valuables to the hospital.    [facility] is not responsible for any belongings or  valuables.  Contacts, dentures/partials or body piercings may not be worn into surgery. Bring a case for your contacts, glasses or hearing aids, a denture cup will be supplied. Leave your suitcase in the car. After surgery it may be brought to your room. For patients admitted to the hospital, discharge time is determined by your treatment team.   Patients discharged the day of surgery will not be allowed to drive home.   Please read over the following fact sheets that you were given:   MRSA Information  __X__ Take these medicines the morning of surgery with A SIP OF WATER:  1. omeprazole (PRILOSEC) 40 MG capsule  2.   3.   4.  5.  6.  ____ Fleet Enema (as directed)   __X__ Use CHG Soap/SAGE wipes as directed  ____ Use inhalers on the day of surgery  ____ Stop metformin/Janumet/Farxiga 2 days prior to surgery    ____ Take 1/2 of usual insulin dose the night before surgery. No insulin the morning          of surgery.   ____ Stop Blood Thinners Coumadin/Plavix/Xarelto/Pleta/Pradaxa/Eliquis/Effient/Aspirin  on   Or contact your Surgeon, Cardiologist or Medical Doctor regarding  ability to stop your blood thinners  __X__ Stop Anti-inflammatories 7 days before surgery such as Advil, Ibuprofen, Motrin,  BC or Goodies Powder, Naprosyn, Naproxen, Aleve, Aspirin    __X__ Stop all herbal supplements, fish oil or vitamin E until after surgery.    ____ Bring C-Pap to the hospital.

## 2020-10-18 ENCOUNTER — Other Ambulatory Visit: Payer: Self-pay

## 2020-10-18 ENCOUNTER — Other Ambulatory Visit
Admission: RE | Admit: 2020-10-18 | Discharge: 2020-10-18 | Disposition: A | Discharge status: Home / Self Care | Payer: BC Managed Care – PPO | Source: Ambulatory Visit | Attending: Surgery | Admitting: Surgery

## 2020-10-18 DIAGNOSIS — Z79899 Other long term (current) drug therapy: Secondary | ICD-10-CM | POA: Diagnosis not present

## 2020-10-18 DIAGNOSIS — Z01812 Encounter for preprocedural laboratory examination: Secondary | ICD-10-CM | POA: Insufficient documentation

## 2020-10-18 DIAGNOSIS — K8020 Calculus of gallbladder without cholecystitis without obstruction: Secondary | ICD-10-CM | POA: Diagnosis not present

## 2020-10-18 DIAGNOSIS — K8010 Calculus of gallbladder with chronic cholecystitis without obstruction: Secondary | ICD-10-CM | POA: Diagnosis not present

## 2020-10-18 DIAGNOSIS — Z20822 Contact with and (suspected) exposure to covid-19: Secondary | ICD-10-CM | POA: Insufficient documentation

## 2020-10-18 LAB — SARS CORONAVIRUS 2 (TAT 6-24 HRS): SARS Coronavirus 2: NEGATIVE

## 2020-10-19 DIAGNOSIS — M9902 Segmental and somatic dysfunction of thoracic region: Secondary | ICD-10-CM | POA: Diagnosis not present

## 2020-10-19 DIAGNOSIS — M9903 Segmental and somatic dysfunction of lumbar region: Secondary | ICD-10-CM | POA: Diagnosis not present

## 2020-10-19 DIAGNOSIS — M5416 Radiculopathy, lumbar region: Secondary | ICD-10-CM | POA: Diagnosis not present

## 2020-10-19 DIAGNOSIS — M546 Pain in thoracic spine: Secondary | ICD-10-CM | POA: Diagnosis not present

## 2020-10-19 MED ORDER — CEFAZOLIN SODIUM-DEXTROSE 2-4 GM/100ML-% IV SOLN
2.0000 g | INTRAVENOUS | Status: AC
  Administered 2020-10-20: 2 g via INTRAVENOUS

## 2020-10-19 MED ORDER — ACETAMINOPHEN 500 MG PO TABS: 1000.0000 mg | ORAL_TABLET | ORAL | Status: AC

## 2020-10-19 MED ORDER — CHLORHEXIDINE GLUCONATE 0.12 % MT SOLN: 15.0000 mL | Freq: Once | OROMUCOSAL | Status: AC

## 2020-10-19 MED ORDER — ORAL CARE MOUTH RINSE: 15.0000 mL | Freq: Once | OROMUCOSAL | Status: AC

## 2020-10-19 MED ORDER — SCOPOLAMINE 1 MG/3DAYS TD PT72: 1.0000 | MEDICATED_PATCH | TRANSDERMAL | Status: DC

## 2020-10-19 MED ORDER — INDOCYANINE GREEN 25 MG IV SOLR
2.5000 mg | INTRAVENOUS | Status: AC
  Administered 2020-10-20: 2.5 mg via INTRAVENOUS
  Filled 2020-10-19: qty 1

## 2020-10-19 MED ORDER — GABAPENTIN 300 MG PO CAPS: 300.0000 mg | ORAL_CAPSULE | ORAL | Status: AC

## 2020-10-19 MED ORDER — LACTATED RINGERS IV SOLN
INTRAVENOUS | Status: DC
  Administered 2020-10-20: 10 mL/h via INTRAVENOUS

## 2020-10-20 ENCOUNTER — Encounter: Payer: Self-pay | Admitting: Surgery

## 2020-10-20 ENCOUNTER — Ambulatory Visit
Admission: RE | Admit: 2020-10-20 | Discharge: 2020-10-20 | Disposition: A | Discharge status: Home / Self Care | Payer: BC Managed Care – PPO | Attending: Surgery | Admitting: Surgery

## 2020-10-20 ENCOUNTER — Ambulatory Visit: Payer: BC Managed Care – PPO | Admitting: Certified Registered"

## 2020-10-20 ENCOUNTER — Other Ambulatory Visit: Payer: Self-pay

## 2020-10-20 ENCOUNTER — Encounter
Admission: RE | Disposition: A | Discharge status: Home / Self Care | Payer: Self-pay | Source: Home / Self Care | Attending: Surgery

## 2020-10-20 DIAGNOSIS — Z79899 Other long term (current) drug therapy: Secondary | ICD-10-CM | POA: Diagnosis not present

## 2020-10-20 DIAGNOSIS — K8010 Calculus of gallbladder with chronic cholecystitis without obstruction: Secondary | ICD-10-CM | POA: Diagnosis not present

## 2020-10-20 DIAGNOSIS — Z20822 Contact with and (suspected) exposure to covid-19: Secondary | ICD-10-CM | POA: Diagnosis not present

## 2020-10-20 DIAGNOSIS — K8020 Calculus of gallbladder without cholecystitis without obstruction: Secondary | ICD-10-CM

## 2020-10-20 LAB — POC URINE PREG, ED: Preg Test, Ur: NEGATIVE

## 2020-10-20 SURGERY — CHOLECYSTECTOMY, ROBOT-ASSISTED, LAPAROSCOPIC
Anesthesia: General

## 2020-10-20 MED ORDER — ONDANSETRON HCL 4 MG/2ML IJ SOLN
INTRAMUSCULAR | Status: DC | PRN
  Administered 2020-10-20: 4 mg via INTRAVENOUS

## 2020-10-20 MED ORDER — CHLORHEXIDINE GLUCONATE 0.12 % MT SOLN
OROMUCOSAL | Status: AC
  Administered 2020-10-20: 15 mL via OROMUCOSAL
  Filled 2020-10-20: qty 15

## 2020-10-20 MED ORDER — PHENYLEPHRINE HCL (PRESSORS) 10 MG/ML IV SOLN
INTRAVENOUS | Status: DC | PRN
  Administered 2020-10-20 (×2): 100 ug via INTRAVENOUS

## 2020-10-20 MED ORDER — FENTANYL CITRATE (PF) 100 MCG/2ML IJ SOLN
INTRAMUSCULAR | Status: AC
  Filled 2020-10-20: qty 2

## 2020-10-20 MED ORDER — LORAZEPAM 2 MG/ML IJ SOLN: 1.0000 mg | Freq: Once | INTRAMUSCULAR | Status: DC | PRN

## 2020-10-20 MED ORDER — SUGAMMADEX SODIUM 200 MG/2ML IV SOLN
INTRAVENOUS | Status: DC | PRN
  Administered 2020-10-20: 180 mg via INTRAVENOUS

## 2020-10-20 MED ORDER — DEXAMETHASONE SODIUM PHOSPHATE 10 MG/ML IJ SOLN
INTRAMUSCULAR | Status: AC
  Filled 2020-10-20: qty 1

## 2020-10-20 MED ORDER — SCOPOLAMINE 1 MG/3DAYS TD PT72
MEDICATED_PATCH | TRANSDERMAL | Status: AC
  Administered 2020-10-20: 1.5 mg via TRANSDERMAL
  Filled 2020-10-20: qty 1

## 2020-10-20 MED ORDER — BUPIVACAINE-EPINEPHRINE (PF) 0.25% -1:200000 IJ SOLN
INTRAMUSCULAR | Status: DC | PRN
  Administered 2020-10-20: 30 mL via PERINEURAL

## 2020-10-20 MED ORDER — ROCURONIUM BROMIDE 100 MG/10ML IV SOLN
INTRAVENOUS | Status: DC | PRN
  Administered 2020-10-20: 50 mg via INTRAVENOUS

## 2020-10-20 MED ORDER — CHLORHEXIDINE GLUCONATE CLOTH 2 % EX PADS: 6.0000 | MEDICATED_PAD | Freq: Once | CUTANEOUS | Status: DC

## 2020-10-20 MED ORDER — FAMOTIDINE 20 MG PO TABS
ORAL_TABLET | ORAL | Status: AC
  Administered 2020-10-20: 20 mg via ORAL
  Filled 2020-10-20: qty 1

## 2020-10-20 MED ORDER — DROPERIDOL 2.5 MG/ML IJ SOLN
0.6250 mg | Freq: Once | INTRAMUSCULAR | Status: DC | PRN
  Filled 2020-10-20: qty 2

## 2020-10-20 MED ORDER — HYDROMORPHONE HCL 1 MG/ML IJ SOLN
0.2500 mg | INTRAMUSCULAR | Status: DC | PRN
  Administered 2020-10-20 (×2): 0.25 mg via INTRAVENOUS

## 2020-10-20 MED ORDER — MEPERIDINE HCL 50 MG/ML IJ SOLN: 6.2500 mg | INTRAMUSCULAR | Status: DC | PRN

## 2020-10-20 MED ORDER — DEXAMETHASONE SODIUM PHOSPHATE 10 MG/ML IJ SOLN
INTRAMUSCULAR | Status: DC | PRN
  Administered 2020-10-20: 5 mg via INTRAVENOUS

## 2020-10-20 MED ORDER — FAMOTIDINE 20 MG PO TABS: 20.0000 mg | ORAL_TABLET | Freq: Once | ORAL | Status: AC

## 2020-10-20 MED ORDER — PROMETHAZINE HCL 25 MG/ML IJ SOLN: 6.2500 mg | INTRAMUSCULAR | Status: DC | PRN

## 2020-10-20 MED ORDER — GABAPENTIN 300 MG PO CAPS
ORAL_CAPSULE | ORAL | Status: AC
  Administered 2020-10-20: 300 mg via ORAL
  Filled 2020-10-20: qty 1

## 2020-10-20 MED ORDER — IBUPROFEN 600 MG PO TABS
600.0000 mg | ORAL_TABLET | Freq: Three times a day (TID) | ORAL | 1 refills | Status: DC | PRN
Start: 2020-10-20 — End: 2021-01-19

## 2020-10-20 MED ORDER — LIDOCAINE HCL (CARDIAC) PF 100 MG/5ML IV SOSY
PREFILLED_SYRINGE | INTRAVENOUS | Status: DC | PRN
  Administered 2020-10-20: 80 mg via INTRAVENOUS

## 2020-10-20 MED ORDER — MIDAZOLAM HCL 2 MG/2ML IJ SOLN
INTRAMUSCULAR | Status: AC
  Filled 2020-10-20: qty 2

## 2020-10-20 MED ORDER — PROPOFOL 10 MG/ML IV BOLUS
INTRAVENOUS | Status: AC
  Filled 2020-10-20: qty 20

## 2020-10-20 MED ORDER — HYDROMORPHONE HCL 1 MG/ML IJ SOLN
INTRAMUSCULAR | Status: AC
  Administered 2020-10-20: 0.25 mg via INTRAVENOUS
  Filled 2020-10-20: qty 1

## 2020-10-20 MED ORDER — MIDAZOLAM HCL 2 MG/2ML IJ SOLN
INTRAMUSCULAR | Status: DC | PRN
  Administered 2020-10-20: 2 mg via INTRAVENOUS

## 2020-10-20 MED ORDER — OXYCODONE HCL 5 MG PO TABS: 5.0000 mg | ORAL_TABLET | ORAL | 0 refills | Status: DC | PRN

## 2020-10-20 MED ORDER — FENTANYL CITRATE (PF) 100 MCG/2ML IJ SOLN
INTRAMUSCULAR | Status: DC | PRN
  Administered 2020-10-20 (×2): 50 ug via INTRAVENOUS

## 2020-10-20 MED ORDER — OXYCODONE HCL 5 MG PO TABS: 5.0000 mg | ORAL_TABLET | Freq: Once | ORAL | Status: DC | PRN

## 2020-10-20 MED ORDER — CEFAZOLIN SODIUM-DEXTROSE 2-4 GM/100ML-% IV SOLN
INTRAVENOUS | Status: AC
  Filled 2020-10-20: qty 100

## 2020-10-20 MED ORDER — BUPIVACAINE-EPINEPHRINE (PF) 0.25% -1:200000 IJ SOLN
INTRAMUSCULAR | Status: AC
  Filled 2020-10-20: qty 30

## 2020-10-20 MED ORDER — OXYCODONE HCL 5 MG/5ML PO SOLN: 5.0000 mg | Freq: Once | ORAL | Status: DC | PRN

## 2020-10-20 MED ORDER — PROPOFOL 10 MG/ML IV BOLUS
INTRAVENOUS | Status: DC | PRN
  Administered 2020-10-20: 150 mg via INTRAVENOUS

## 2020-10-20 MED ORDER — ACETAMINOPHEN 500 MG PO TABS
ORAL_TABLET | ORAL | Status: AC
  Administered 2020-10-20: 1000 mg via ORAL
  Filled 2020-10-20: qty 2

## 2020-10-20 SURGICAL SUPPLY — 60 items
ADH SKN CLS APL DERMABOND .7 (GAUZE/BANDAGES/DRESSINGS) ×1
APL PRP STRL LF DISP 70% ISPRP (MISCELLANEOUS) ×1
BAG INFUSER PRESSURE 100CC (MISCELLANEOUS) IMPLANT
BAG SPEC RTRVL LRG 6X4 10 (ENDOMECHANICALS) ×1
CANISTER SUCT 1200ML W/VALVE (MISCELLANEOUS) ×3 IMPLANT
CANNULA REDUC XI 12-8 STAPL (CANNULA) ×1
CANNULA REDUC XI 12-8MM STAPL (CANNULA) ×1
CANNULA REDUCER 12-8 DVNC XI (CANNULA) ×1 IMPLANT
CHLORAPREP W/TINT 26 (MISCELLANEOUS) ×3 IMPLANT
CLIP VESOLOCK MED LG 6/CT (CLIP) ×6 IMPLANT
COVER WAND RF STERILE (DRAPES) ×3 IMPLANT
CUP MEDICINE 2OZ PLAST GRAD ST (MISCELLANEOUS) ×3 IMPLANT
DECANTER SPIKE VIAL GLASS SM (MISCELLANEOUS) ×3 IMPLANT
DEFOGGER SCOPE WARMER CLEARIFY (MISCELLANEOUS) ×3 IMPLANT
DERMABOND ADVANCED (GAUZE/BANDAGES/DRESSINGS) ×2
DERMABOND ADVANCED .7 DNX12 (GAUZE/BANDAGES/DRESSINGS) ×1 IMPLANT
DRAPE ARM DVNC X/XI (DISPOSABLE) ×4 IMPLANT
DRAPE COLUMN DVNC XI (DISPOSABLE) ×1 IMPLANT
DRAPE DA VINCI XI ARM (DISPOSABLE) ×8
DRAPE DA VINCI XI COLUMN (DISPOSABLE) ×2
ELECT CAUTERY BLADE TIP 2.5 (TIP) ×3
ELECT REM PT RETURN 9FT ADLT (ELECTROSURGICAL) ×3
ELECTRODE CAUTERY BLDE TIP 2.5 (TIP) ×1 IMPLANT
ELECTRODE REM PT RTRN 9FT ADLT (ELECTROSURGICAL) ×1 IMPLANT
GLOVE BIO SURGEON STRL SZ7 (GLOVE) ×6 IMPLANT
GLOVE BIOGEL PI IND STRL 7.0 (GLOVE) ×2 IMPLANT
GLOVE BIOGEL PI INDICATOR 7.0 (GLOVE) ×4
GLOVE SURG SYN 7.0 (GLOVE) ×6 IMPLANT
GLOVE SURG SYN 7.5  E (GLOVE) ×4
GLOVE SURG SYN 7.5 E (GLOVE) ×2 IMPLANT
GOWN STRL REUS W/ TWL LRG LVL3 (GOWN DISPOSABLE) ×3 IMPLANT
GOWN STRL REUS W/TWL LRG LVL3 (GOWN DISPOSABLE) ×9
IRRIGATOR SUCT 8 DISP DVNC XI (IRRIGATION / IRRIGATOR) IMPLANT
IRRIGATOR SUCTION 8MM XI DISP (IRRIGATION / IRRIGATOR)
IV NS 1000ML (IV SOLUTION)
IV NS 1000ML BAXH (IV SOLUTION) IMPLANT
KIT PINK PAD W/HEAD ARE REST (MISCELLANEOUS) ×3
KIT PINK PAD W/HEAD ARM REST (MISCELLANEOUS) ×1 IMPLANT
LABEL OR SOLS (LABEL) ×3 IMPLANT
MANIFOLD NEPTUNE II (INSTRUMENTS) IMPLANT
NEEDLE HYPO 22GX1.5 SAFETY (NEEDLE) ×3 IMPLANT
NS IRRIG 500ML POUR BTL (IV SOLUTION) ×3 IMPLANT
OBTURATOR OPTICAL STANDARD 8MM (TROCAR) ×2
OBTURATOR OPTICAL STND 8 DVNC (TROCAR) ×1
OBTURATOR OPTICALSTD 8 DVNC (TROCAR) ×1 IMPLANT
PACK LAP CHOLECYSTECTOMY (MISCELLANEOUS) ×3 IMPLANT
PENCIL ELECTRO HAND CTR (MISCELLANEOUS) ×3 IMPLANT
POUCH SPECIMEN RETRIEVAL 10MM (ENDOMECHANICALS) ×3 IMPLANT
SEAL CANN UNIV 5-8 DVNC XI (MISCELLANEOUS) ×4 IMPLANT
SEAL XI 5MM-8MM UNIVERSAL (MISCELLANEOUS) ×8
SET TUBE SMOKE EVAC HIGH FLOW (TUBING) ×3 IMPLANT
SOLUTION ELECTROLUBE (MISCELLANEOUS) ×3 IMPLANT
SPONGE LAP 18X18 RF (DISPOSABLE) IMPLANT
SPONGE LAP 4X18 RFD (DISPOSABLE) ×3 IMPLANT
STAPLER CANNULA SEAL DVNC XI (STAPLE) ×1 IMPLANT
STAPLER CANNULA SEAL XI (STAPLE) ×2
SUT MNCRL AB 4-0 PS2 18 (SUTURE) ×3 IMPLANT
SUT VICRYL 0 AB UR-6 (SUTURE) ×6 IMPLANT
TAPE TRANSPORE STRL 2 31045 (GAUZE/BANDAGES/DRESSINGS) ×3 IMPLANT
TROCAR BALLN GELPORT 12X130M (ENDOMECHANICALS) ×3 IMPLANT

## 2020-10-20 NOTE — Discharge Instructions (Signed)
Laparoscopic Cholecystectomy Laparoscopic cholecystectomy is surgery to remove the gallbladder. The gallbladder is a pear-shaped organ that lies beneath the liver on the right side of the body. The gallbladder stores bile, which is a fluid that helps the body to digest fats. Cholecystectomy is often done for inflammation of the gallbladder (cholecystitis). This condition is usually caused by a buildup of gallstones (cholelithiasis) in the gallbladder. Gallstones can block the flow of bile, which can result in inflammation and pain. In severe cases, emergency surgery may be required. This procedure is done though small incisions in your abdomen (laparoscopic surgery). A thin scope with a camera (laparoscope) is inserted through one incision. Thin surgical instruments are inserted through the other incisions. In some cases, a laparoscopic procedure may be turned into a type of surgery that is done through a larger incision (open surgery). Tell a health care provider about:  Any allergies you have.  All medicines you are taking, including vitamins, herbs, eye drops, creams, and over-the-counter medicines.  Any problems you or family members have had with anesthetic medicines.  Any blood disorders you have.  Any surgeries you have had.  Any medical conditions you have.  Whether you are pregnant or may be pregnant. What are the risks? Generally, this is a safe procedure. However, problems may occur, including:  Infection.  Bleeding.  Allergic reactions to medicines.  Damage to other structures or organs.  A stone remaining in the common bile duct. The common bile duct carries bile from the gallbladder into the small intestine.  A bile leak from the cyst duct that is clipped when your gallbladder is removed. What happens before the procedure? Staying hydrated Follow instructions from your health care provider about hydration, which may include:  Up to 2 hours before the procedure - you  may continue to drink clear liquids, such as water, clear fruit juice, black coffee, and plain tea. Eating and drinking restrictions Follow instructions from your health care provider about eating and drinking, which may include:  8 hours before the procedure - stop eating heavy meals or foods such as meat, fried foods, or fatty foods.  6 hours before the procedure - stop eating light meals or foods, such as toast or cereal.  6 hours before the procedure - stop drinking milk or drinks that contain milk.  2 hours before the procedure - stop drinking clear liquids. Medicines  Ask your health care provider about: ? Changing or stopping your regular medicines. This is especially important if you are taking diabetes medicines or blood thinners. ? Taking medicines such as aspirin and ibuprofen. These medicines can thin your blood. Do not take these medicines before your procedure if your health care provider instructs you not to.  You may be given antibiotic medicine to help prevent infection. General instructions  Let your health care provider know if you develop a cold or an infection before surgery.  Plan to have someone take you home from the hospital or clinic.  Ask your health care provider how your surgical site will be marked or identified. What happens during the procedure?   To reduce your risk of infection: ? Your health care team will wash or sanitize their hands. ? Your skin will be washed with soap. ? Hair may be removed from the surgical area.  An IV tube may be inserted into one of your veins.  You will be given one or more of the following: ? A medicine to help you relax (sedative). ? A   medicine to make you fall asleep (general anesthetic).  A breathing tube will be placed in your mouth.  Your surgeon will make several small cuts (incisions) in your abdomen.  The laparoscope will be inserted through one of the small incisions. The camera on the laparoscope will  send images to a TV screen (monitor) in the operating room. This lets your surgeon see inside your abdomen.  Air-like gas will be pumped into your abdomen. This will expand your abdomen to give the surgeon more room to perform the surgery.  Other tools that are needed for the procedure will be inserted through the other incisions. The gallbladder will be removed through one of the incisions.  Your common bile duct may be examined. If stones are found in the common bile duct, they may be removed.  After your gallbladder has been removed, the incisions will be closed with stitches (sutures), staples, or skin glue.  Your incisions may be covered with a bandage (dressing). The procedure may vary among health care providers and hospitals. What happens after the procedure?  Your blood pressure, heart rate, breathing rate, and blood oxygen level will be monitored until the medicines you were given have worn off.  You will be given medicines as needed to control your pain.  Do not drive for 24 hours if you were given a sedative. This information is not intended to replace advice given to you by your health care provider. Make sure you discuss any questions you have with your health care provider. Document Revised: 10/18/2017 Document Reviewed: 04/23/2016 Elsevier Patient Education  2020 [employer] AMBULATORY SURGERY  DISCHARGE INSTRUCTIONS   1) The drugs that you were given will stay in your system until tomorrow so for the next 24 hours you should not:  A) Drive an automobile B) Make any legal decisions C) Drink any alcoholic beverage   2) You may resume regular meals tomorrow.  Today it is better to start with liquids and gradually work up to solid foods.  You may eat anything you prefer, but it is better to start with liquids, then soup and crackers, and gradually work up to solid foods.   3) Please notify your doctor immediately if you have any unusual bleeding, trouble  breathing, redness and pain at the surgery site, drainage, fever, or pain not relieved by medication.    4) Additional Instructions:        Please contact your physician with any problems or Same Day Surgery at [phone], Monday through Friday 6 am to 4 pm, or [place] at [facility] number at [phone].

## 2020-10-20 NOTE — Interval H&P Note (Signed)
History and Physical Interval Note:  10/20/2020 7:37 AM  Kayla West  has presented today for surgery, with the diagnosis of Symptomatic cholelithiasis.  The various methods of treatment have been discussed with the patient and family. After consideration of risks, benefits and other options for treatment, the patient has consented to  Procedure(s): XI ROBOTIC ASSISTED LAPAROSCOPIC CHOLECYSTECTOMY (N/A) INDOCYANINE GREEN FLUORESCENCE IMAGING (ICG) (N/A) as a surgical intervention.  The patient's history has been reviewed, patient examined, no change in status, stable for surgery.  I have reviewed the patient's chart and labs.  Questions were answered to the patient's satisfaction.     Shaun Runyon

## 2020-10-20 NOTE — Anesthesia Postprocedure Evaluation (Signed)
Anesthesia Post Note  Patient: Kayla West  Procedure(s) Performed: XI ROBOTIC ASSISTED LAPAROSCOPIC CHOLECYSTECTOMY (N/A ) INDOCYANINE GREEN FLUORESCENCE IMAGING (ICG) (N/A )  Patient location during evaluation: PACU Anesthesia Type: General Level of consciousness: awake Pain management: pain level controlled Vital Signs Assessment: post-procedure vital signs reviewed and stable Respiratory status: spontaneous breathing Cardiovascular status: blood pressure returned to baseline and stable Postop Assessment: no apparent nausea or vomiting Anesthetic complications: no   No complications documented.   Last Vitals:  Vitals:   10/20/20 1033 10/20/20 1050  BP: 114/76 121/69  Pulse: 77 74  Resp: 12 18  Temp: 36.5 C 36.8 C  SpO2: 95% 98%    Last Pain:  Vitals:   10/20/20 1050  TempSrc: Temporal  PainSc:                  Emilio Math

## 2020-10-20 NOTE — Anesthesia Preprocedure Evaluation (Signed)
Anesthesia Evaluation  Patient identified by MRN, date of birth, ID band Patient awake    Reviewed: Allergy & Precautions, NPO status , Patient's Chart, lab work & pertinent test results  History of Anesthesia Complications (+) PONV and history of anesthetic complications  Airway Mallampati: II       Dental  (+) Chipped   Pulmonary neg pulmonary ROS,    Pulmonary exam normal breath sounds clear to auscultation       Cardiovascular negative cardio ROS Normal cardiovascular exam Rhythm:Regular Rate:Normal     Neuro/Psych negative neurological ROS  negative psych ROS   GI/Hepatic Neg liver ROS, GERD  ,  Endo/Other  negative endocrine ROS  Renal/GU negative Renal ROS  negative genitourinary   Musculoskeletal negative musculoskeletal ROS (+)   Abdominal   Peds negative pediatric ROS (+)  Hematology negative hematology ROS (+)   Anesthesia Other Findings Past Medical History: 06/07/2017: Abnormal TSH     Comment:  3.8 on NOB labs and started on Synthroid. Recheck late               August. 04/2020: BRCA negative     Comment:  MyRisk neg 10/07/2017: Disorder of thyroid, antepartum 12/08/2019: Endometrial polyp No date: Family history of breast cancer No date: Family history of pancreatic cancer No date: GERD (gastroesophageal reflux disease) 07/01/2017: History of pre-eclampsia 04/2020: Increased risk of breast cancer     Comment:  IBIS=24.9%/riskscore=16.5% No date: PONV (postoperative nausea and vomiting) 02/22/2014: Positive ANA (antinuclear antibody)     Comment:  Sees Rheumatology, no meds   Reproductive/Obstetrics negative OB ROS                             Anesthesia Physical Anesthesia Plan  ASA: II  Anesthesia Plan: General   Post-op Pain Management:    Induction: Intravenous  PONV Risk Score and Plan: 4 or greater and Ondansetron, Dexamethasone and  Promethazine  Airway Management Planned: Oral ETT  Additional Equipment:   Intra-op Plan:   Post-operative Plan: Extubation in OR  Informed Consent: I have reviewed the patients History and Physical, chart, labs and discussed the procedure including the risks, benefits and alternatives for the proposed anesthesia with the patient or authorized representative who has indicated his/her understanding and acceptance.     Dental advisory given  Plan Discussed with: CRNA, Anesthesiologist and Surgeon  Anesthesia Plan Comments:         Anesthesia Quick Evaluation

## 2020-10-20 NOTE — Anesthesia Procedure Notes (Signed)
Procedure Name: Intubation Performed by: Fredderick Phenix, CRNA Pre-anesthesia Checklist: Patient identified, Emergency Drugs available, Suction available and Patient being monitored Patient Re-evaluated:Patient Re-evaluated prior to induction Oxygen Delivery Method: Circle system utilized Preoxygenation: Pre-oxygenation with 100% oxygen Induction Type: IV induction Ventilation: Mask ventilation without difficulty Laryngoscope Size: Mac and 3 Grade View: Grade II Tube type: Oral Tube size: 7.0 mm Number of attempts: 1 Airway Equipment and Method: Stylet and Oral airway Placement Confirmation: ETT inserted through vocal cords under direct vision,  positive ETCO2 and breath sounds checked- equal and bilateral Secured at: 20 cm Tube secured with: Tape Dental Injury: Teeth and Oropharynx as per pre-operative assessment

## 2020-10-20 NOTE — Op Note (Signed)
  Procedure Date:  10/20/2020  Pre-operative Diagnosis:  Symptomatic cholelithiasis  Post-operative Diagnosis:  Symptomatic cholelithiasis  Procedure:  Robotic assisted cholecystectomy with ICG FireFly cholangiogram  Surgeon:  Howie Ill, MD  Anesthesia:  General endotracheal  Estimated Blood Loss:  10 ml  Specimens:  gallbladder  Complications:  None  Indications for Procedure:  This is a 44 y.o. female who presents with abdominal pain and workup revealing symptomatic cholelithiasis.  The benefits, complications, treatment options, and expected outcomes were discussed with the patient. The risks of bleeding, infection, recurrence of symptoms, failure to resolve symptoms, bile duct damage, bile duct leak, retained common bile duct stone, bowel injury, and need for further procedures were all discussed with the patient and she was willing to proceed.  Description of Procedure: The patient was correctly identified in the preoperative area and brought into the operating room.  The patient was placed supine with VTE prophylaxis in place.  Appropriate time-outs were performed.  Anesthesia was induced and the patient was intubated.  Appropriate antibiotics were infused.  The abdomen was prepped and draped in a sterile fashion. An infraumbilical incision was made. A cutdown technique was used to enter the abdominal cavity without injury, and a 12 mm robotic port was inserted.  Pneumoperitoneum was obtained with appropriate opening pressures.  Three 8-mm ports were placed in the mid abdomen at the level of the umbilicus under direct visualization.  The DaVinci platform was docked, camera targeted, and instruments were placed under direct visualization.  The gallbladder was identified.  The fundus was grasped and retracted cephalad.  Adhesions were lysed bluntly and with electrocautery. The infundibulum was grasped and retracted laterally, exposing the peritoneum overlying the gallbladder.   This was incised with electrocautery and extended on either side of the gallbladder.  FireFly cholangiogram was then obtained, and we were able to clearly identify the cystic duct and common bile duct.  The cystic duct and cystic artery were carefully dissected with combination of cautery and blunt dissection.  Both were clipped twice proximally and once distally, cutting in between.  The gallbladder was taken from the gallbladder fossa in a retrograde fashion with electrocautery. The gallbladder was placed in an Endocatch bag. The liver bed was inspected and any bleeding was controlled with electrocautery. The right upper quadrant was then inspected again revealing intact clips, no bleeding, and no ductal injury.  The 8 mm ports were removed under direct visualization and the 12 mm port was removed.  The Endocatch bag was brought out via the umbilical incision. The fascial opening was closed using 0 vicryl suture.  Local anesthetic was infused in all incisions and the incisions were closed with 4-0 Monocryl.  The wounds were cleaned and sealed with DermaBond.  The patient was emerged from anesthesia and extubated and brought to the recovery room for further management.  The patient tolerated the procedure well and all counts were correct at the end of the case.   Howie Ill, MD

## 2020-10-20 NOTE — Progress Notes (Signed)
   10/20/20 1300  Clinical Encounter Type  Visited With Family  Visit Type Initial  Referral From Chaplain  Consult/Referral To Chaplain  While rounding [facility] waiting area, chaplain visited with Pt's husband and asked if he had any questions or concerns. He was concerned about type of anesthesia being used and he said his wife told him it was going to be a local.

## 2020-10-20 NOTE — Transfer of Care (Signed)
Immediate Anesthesia Transfer of Care Note  Patient: Kayla West  Procedure(s) Performed: XI ROBOTIC ASSISTED LAPAROSCOPIC CHOLECYSTECTOMY (N/A ) INDOCYANINE GREEN FLUORESCENCE IMAGING (ICG) (N/A )  Patient Location: PACU  Anesthesia Type:General  Level of Consciousness: drowsy  Airway & Oxygen Therapy: Patient Spontanous Breathing  Post-op Assessment: Report given to RN and Post -op Vital signs reviewed and stable  Post vital signs: Reviewed and stable  Last Vitals:  Vitals Value Taken Time  BP 131/76 10/20/20 0953  Temp    Pulse 78 10/20/20 0954  Resp 16 10/20/20 0954  SpO2 100 % 10/20/20 0954  Vitals shown include unvalidated device data.  Last Pain:  Vitals:   10/20/20 0953  TempSrc:   PainSc: (P) Asleep      Patients Stated Pain Goal: 0 (10/20/20 0630)  Complications: No complications documented.

## 2020-10-21 LAB — SURGICAL PATHOLOGY

## 2020-10-31 ENCOUNTER — Other Ambulatory Visit: Payer: Self-pay | Admitting: Obstetrics and Gynecology

## 2020-10-31 DIAGNOSIS — Z1231 Encounter for screening mammogram for malignant neoplasm of breast: Secondary | ICD-10-CM

## 2020-11-01 ENCOUNTER — Telehealth: Payer: Self-pay | Admitting: Surgery

## 2020-11-01 NOTE — Telephone Encounter (Signed)
Patient calls, she is post surgery with Dr. Aleen Campi on 12/2 and has a follow up with him on 11/04/20.  She is having terrible diarrhea.  Please call her.  Thank you.

## 2020-11-01 NOTE — Telephone Encounter (Signed)
Patient states she is having diarrhea since today- at first this morning it was looking like the stool was forming-since then she has gone 5 times -watery and yellowish- spoke with Dr.Piscoya-try Metamucil(Fiber) and if this doesn't work try Imodium.  If diarrhea persists please call back. Be sure to stay hydrated. Patient denies pain, or fever and she is eating ok.

## 2020-11-04 ENCOUNTER — Encounter: Payer: Self-pay | Admitting: Obstetrics and Gynecology

## 2020-11-04 ENCOUNTER — Other Ambulatory Visit: Payer: Self-pay

## 2020-11-04 ENCOUNTER — Encounter: Payer: Self-pay | Admitting: Surgery

## 2020-11-04 ENCOUNTER — Ambulatory Visit (INDEPENDENT_AMBULATORY_CARE_PROVIDER_SITE_OTHER): Payer: BC Managed Care – PPO | Admitting: Obstetrics and Gynecology

## 2020-11-04 ENCOUNTER — Ambulatory Visit (INDEPENDENT_AMBULATORY_CARE_PROVIDER_SITE_OTHER): Payer: BC Managed Care – PPO | Admitting: Surgery

## 2020-11-04 VITALS — BP 125/82 | HR 82 | Temp 98.1°F | Ht 62.0 in | Wt 183.8 lb

## 2020-11-04 VITALS — BP 126/80 | Ht 62.0 in | Wt 184.0 lb

## 2020-11-04 DIAGNOSIS — Z803 Family history of malignant neoplasm of breast: Secondary | ICD-10-CM

## 2020-11-04 DIAGNOSIS — M5416 Radiculopathy, lumbar region: Secondary | ICD-10-CM | POA: Diagnosis not present

## 2020-11-04 DIAGNOSIS — Z1339 Encounter for screening examination for other mental health and behavioral disorders: Secondary | ICD-10-CM | POA: Diagnosis not present

## 2020-11-04 DIAGNOSIS — Z1331 Encounter for screening for depression: Secondary | ICD-10-CM | POA: Diagnosis not present

## 2020-11-04 DIAGNOSIS — Z01419 Encounter for gynecological examination (general) (routine) without abnormal findings: Secondary | ICD-10-CM | POA: Diagnosis not present

## 2020-11-04 DIAGNOSIS — M9903 Segmental and somatic dysfunction of lumbar region: Secondary | ICD-10-CM | POA: Diagnosis not present

## 2020-11-04 DIAGNOSIS — K8020 Calculus of gallbladder without cholecystitis without obstruction: Secondary | ICD-10-CM

## 2020-11-04 DIAGNOSIS — M546 Pain in thoracic spine: Secondary | ICD-10-CM | POA: Diagnosis not present

## 2020-11-04 DIAGNOSIS — M9902 Segmental and somatic dysfunction of thoracic region: Secondary | ICD-10-CM | POA: Diagnosis not present

## 2020-11-04 NOTE — Progress Notes (Signed)
Gynecology Annual Exam  PCP: Will Bonnet, MD  Chief Complaint  Patient presents with  . Annual Exam   History of Present Illness:  Ms. Kayla West is a 44 y.o. [phone] who LMP was Patient's last menstrual period was 10/31/2020., presents today for her annual examination.  Her menses are regular every 28-30 days, lasting 4 day(s).  Dysmenorrhea none. She does not have intermenstrual bleeding.  She is sexually active. Her husband had a vasectomy this past year.   Last Pap: 08/2019  Results were: no abnormalities /neg HPV DNA.negative Hx of STDs: none  She has a high risk of breast cancer (see previous note).  She had an MRI in July. She wound up needing a biopsy that was non-cancerous.  She is due for a mammogram in January.  She is on a rotation of mammograms and MRIs.  She is getting twice yearly breast exams.   Colonoscopy: never had  DEXA: has not been screened for osteoporosis  Tobacco use: The patient denies current or previous tobacco use.  Alcohol use: social drinker Exercise: increasing as able with very busy schedule.   The patient wears seatbelts: yes.     She recently had her gall bladder out due to cholelithiasis.   She has been getting colder at night. In the last six months she is feeling significantly overwhelmed. This has been exacerbated by her recent surgery. She has access to EAP at work. She works at [employer].    Past Medical History:  Diagnosis Date  . Abnormal TSH 06/07/2017   3.8 on NOB labs and started on Synthroid. Recheck late August.  . BRCA negative 04/2020   MyRisk neg  . 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 Endometrial polyp 12/08/2019  . Family history of breast cancer   . Family history of pancreatic cancer   . GERD (gastroesophageal reflux disease)   . History of pre-eclampsia 07/01/2017  . Increased risk of breast cancer 04/2020   IBIS=24.9%/riskscore=16.5%  . PONV (postoperative nausea and vomiting)   . Positive ANA  (antinuclear antibody) 02/22/2014   Sees Rheumatology, no meds    Past Surgical History:  Procedure Laterality Date  . BREAST LUMPECTOMY Left 2010   benign indication  . CESAREAN SECTION     2019, 2015  . DENTAL SURGERY    . DILATATION & CURETTAGE/HYSTEROSCOPY WITH MYOSURE N/A 12/08/2019   Procedure: DILATATION & CURETTAGE/HYSTEROSCOPY WITH MYOSURE POLYPECOMTY;  Surgeon: Will Bonnet, MD;  Location: [facility] ORS;  Service: Gynecology;  Laterality: N/A;    Prior to Admission medications: Denies    Allergies: No Known Allergies  Obstetric History: [id number]  Family History  Problem Relation Age of Onset  . Pancreatic cancer Mother 73  . Breast cancer Maternal Aunt        twice-50s and 28s  . Breast cancer Maternal Grandmother        twice- 81s and 18s  . Breast cancer Maternal Aunt 77    Social History   Socioeconomic History  . Marital status: Married    Spouse name: Jeneen Rinks  . Number of children: Not on file  . Years of education: Not on file  . Highest education level: Not on file  Occupational History  . Occupation: professor  Tobacco Use  . Smoking status: Never Smoker  . Smokeless tobacco: Never Used  Vaping Use  . Vaping Use: Never used  Substance and Sexual Activity  . Alcohol use: Not Currently    Comment: occassion  . Drug  use: Never  . Sexual activity: Yes    Birth control/protection: None    Comment: Vasectomy  Other Topics Concern  . Not on file  Social History Narrative   Patient lives with husband and 3 children.  Feels safe in her home   Social Determinants of Health   Financial Resource Strain: Not on file  Food Insecurity: Not on file  Transportation Needs: Not on file  Physical Activity: Not on file  Stress: Not on file  Social Connections: Not on file  Intimate Partner Violence: Not on file    Review of Systems  Constitutional: Negative.   HENT: Negative.   Eyes: Negative.   Respiratory: Negative.   Cardiovascular: Negative.    Gastrointestinal: Negative.   Genitourinary: Negative.   Musculoskeletal: Negative.   Skin: Negative.   Neurological: Negative.   Psychiatric/Behavioral: Negative.      Physical Exam BP 126/80   Ht _0  (1.575 m)   Wt 184 lb (83.5 kg)   LMP 10/31/2020   BMI 33.65 kg/m   Physical Exam Constitutional:      General: She is not in acute distress.    Appearance: Normal appearance. She is well-developed.  Genitourinary:     Vulva and bladder normal.     Right Labia: No rash, tenderness, lesions, skin changes or Bartholin's cyst.    Left Labia: No tenderness, lesions, skin changes, Bartholin's cyst or rash.    No inguinal adenopathy present in the right or left side.    Pelvic Tanner Score: 5/5.    No vaginal discharge, erythema, tenderness or bleeding.      Right Adnexa: not tender, not full and no mass present.    Left Adnexa: not tender, not full and no mass present.    No cervical motion tenderness, discharge, lesion or polyp.     Uterus is not enlarged or tender.     No uterine mass detected.    Pelvic exam was performed with patient in the lithotomy position.  Breasts:     Right: No inverted nipple, mass, nipple discharge, skin change or tenderness.     Left: No inverted nipple, mass, nipple discharge, skin change or tenderness.    HENT:     Head: Normocephalic and atraumatic.  Eyes:     General: No scleral icterus.    Conjunctiva/sclera: Conjunctivae normal.  Neck:     Thyroid: No thyromegaly.  Cardiovascular:     Rate and Rhythm: Normal rate and regular rhythm.     Heart sounds: No murmur heard. No friction rub. No gallop.   Pulmonary:     Effort: Pulmonary effort is normal. No respiratory distress.     Breath sounds: Normal breath sounds. No wheezing or rales.  Abdominal:     General: Bowel sounds are normal. There is no distension.     Palpations: Abdomen is soft. There is no mass.     Tenderness: There is no abdominal tenderness. There is no guarding or  rebound.     Hernia: There is no hernia in the left inguinal area or right inguinal area.  Musculoskeletal:        General: No swelling or tenderness. Normal range of motion.     Cervical back: Normal range of motion and neck supple.  Lymphadenopathy:     Cervical: No cervical adenopathy.     Lower Body: No right inguinal adenopathy. No left inguinal adenopathy.  Neurological:     General: No focal deficit present.  Mental Status: She is alert and oriented to person, place, and time.     Cranial Nerves: No cranial nerve deficit.  Skin:    General: Skin is warm and dry.     Findings: No erythema or rash.  Psychiatric:        Mood and Affect: Mood normal.        Behavior: Behavior normal.        Judgment: Judgment normal.   Female chaperone present for pelvic and breast  portions of the physical exam  Results: AUDIT Questionnaire (screen for alcoholism): 1 PHQ-9: 1  Assessment: 44 y.o. G31P2013 female here for routine gynecologic examination.  Plan: Problem List Items Addressed This Visit   None   Visit Diagnoses    Women's annual routine gynecological examination    -  Primary   Screening for depression       Screening for alcoholism       Family history of breast cancer          Screening: -- Blood pressure screen normal -- Colonoscopy - not due -- Mammogram - due - already scheduled at [facility] on January 2022 -- Weight screening: overweight: continue to monitor -- Depression screening negative (PHQ-9) -- Nutrition: normal -- cholesterol screening: not due for screening -- osteoporosis screening: not due -- tobacco screening: not using -- alcohol screening: AUDIT questionnaire indicates low-risk usage. -- family history of breast cancer screening: done. not at high risk. -- no evidence of domestic violence or intimate partner violence. -- STD screening: gonorrhea/chlamydia NAAT not collected per patient request. -- pap smear not collected per [facility]P  guidelines  Family history of breast cancer. Elevated personal risk of breast cancer. Continue monitoring as previously discussed.   She will engage the EAP at [facility] to help her manage her stressors. She does not wish to be on medication at this time.  Prentice Docker, MD 11/04/2020 1:33 PM

## 2020-11-04 NOTE — Progress Notes (Signed)
11/04/2020  HPI: Kayla West is a 44 y.o. female s/p robotic assisted cholecystectomy on 10/20/20.  She called the office on 12/14 with reports of having diarrhea.  Fiber/imodium was recommended.  Today she reports that her diarrhea has significantly improved.  Denies any worsening pain, nausea, or vomiting.  Reports some discomfort in the LUQ, and she stopped taking her Prilosec when the diarrhea started.  She also reports some discomfort in the low abdomen with the diarrhea, but she feels it's related to the bowel movement itself.  Vital signs: BP 125/82   Pulse 82   Temp 98.1 F (36.7 C) (Oral)   Ht 5\' 2"  (1.575 m)   Wt 183 lb 12.8 oz (83.4 kg)   SpO2 98%   BMI 33.62 kg/m    Physical Exam: Constitutional:  No acute distress Abdomen:  Soft, non-distended, non-tender to palpation.  Incisions are healing well and are clean, dry, intact.    Assessment/Plan: This is a 43 y.o. female s/p robotic assisted cholecystectomy.  --Discussed with the patient that the diarrhea is most likely as part of not having a gallbladder anymore.  Discussed with her that her body may take 2-3 weeks to adjust from her surgery and this should normalize again.  She should continue taking fiber until her bowel movements are back to normal. --Incisions are healing well, without complications. --Recommended that she resumes her Prilosec again, as her LUQ discomfort may be related to her GERD issues. --Follow up as needed.  45, MD [facility]

## 2020-11-04 NOTE — Patient Instructions (Addendum)
Continue to use the Metamucil. Please be sure to increase water intake daily. Start back taking the Prilosec.    GENERAL POST-OPERATIVE PATIENT INSTRUCTIONS   WOUND CARE INSTRUCTIONS:  Keep a dry clean dressing on the wound if there is drainage. The initial bandage may be removed after 24 hours.  Once the wound has quit draining you may leave it open to air.  If clothing rubs against the wound or causes irritation and the wound is not draining you may cover it with a dry dressing during the daytime.  Try to keep the wound dry and avoid ointments on the wound unless directed to do so.  If the wound becomes bright red and painful or starts to drain infected material that is not clear, please contact your physician immediately.  If the wound is mildly pink and has a thick firm ridge underneath it, this is normal, and is referred to as a healing ridge.  This will resolve over the next 4-6 weeks.  BATHING: You may shower if you have been informed of this by your surgeon. However, Please do not submerge in a tub, hot tub, or pool until incisions are completely sealed or have been told by your surgeon that you may do so.  DIET:  You may eat any foods that you can tolerate.  It is a good idea to eat a high fiber diet and take in plenty of fluids to prevent constipation.  If you do become constipated you may want to take a mild laxative or take ducolax tablets on a daily basis until your bowel habits are regular.  Constipation can be very uncomfortable, along with straining, after recent surgery.  ACTIVITY:  You are encouraged to cough and deep breath or use your incentive spirometer if you were given one, every 15-30 minutes when awake.  This will help prevent respiratory complications and low grade fevers post-operatively if you had a general anesthetic.  You may want to hug a pillow when coughing and sneezing to add additional support to the surgical area, if you had abdominal or chest surgery, which will  decrease pain during these times.  You are encouraged to walk and engage in light activity for the next two weeks.  You should not lift more than 20 pounds, until 12/01/2020 as it could put you at increased risk for complications.  Twenty pounds is roughly equivalent to a plastic bag of groceries. At that time- Listen to your body when lifting, if you have pain when lifting, stop and then try again in a few days. Soreness after doing exercises or activities of daily living is normal as you get back in to your normal routine.  MEDICATIONS:  Try to take narcotic medications and anti-inflammatory medications, such as tylenol, ibuprofen, naprosyn, etc., with food.  This will minimize stomach upset from the medication.  Should you develop nausea and vomiting from the pain medication, or develop a rash, please discontinue the medication and contact your physician.  You should not drive, make important decisions, or operate machinery when taking narcotic pain medication.  SUNBLOCK Use sun block to incision area over the next year if this area will be exposed to sun. This helps decrease scarring and will allow you avoid a permanent darkened area over your incision.  QUESTIONS:  Please feel free to call our office if you have any questions, and we will be glad to assist you. [phone]

## 2020-11-28 DIAGNOSIS — Z86018 Personal history of other benign neoplasm: Secondary | ICD-10-CM | POA: Diagnosis not present

## 2020-11-28 DIAGNOSIS — D225 Melanocytic nevi of trunk: Secondary | ICD-10-CM | POA: Diagnosis not present

## 2020-11-28 DIAGNOSIS — L578 Other skin changes due to chronic exposure to nonionizing radiation: Secondary | ICD-10-CM | POA: Diagnosis not present

## 2020-11-28 DIAGNOSIS — L918 Other hypertrophic disorders of the skin: Secondary | ICD-10-CM | POA: Diagnosis not present

## 2020-11-28 DIAGNOSIS — D485 Neoplasm of uncertain behavior of skin: Secondary | ICD-10-CM | POA: Diagnosis not present

## 2020-12-09 ENCOUNTER — Other Ambulatory Visit: Payer: Self-pay | Admitting: Obstetrics and Gynecology

## 2020-12-09 DIAGNOSIS — Z30011 Encounter for initial prescription of contraceptive pills: Secondary | ICD-10-CM

## 2020-12-12 ENCOUNTER — Ambulatory Visit: Payer: BC Managed Care – PPO

## 2020-12-12 ENCOUNTER — Other Ambulatory Visit: Payer: Self-pay | Admitting: Gastroenterology

## 2020-12-12 NOTE — Telephone Encounter (Signed)
Last office visit 08/19/2020 Dyspepsia  Last refill 09/12/2020 4 refills

## 2020-12-13 DIAGNOSIS — D235 Other benign neoplasm of skin of trunk: Secondary | ICD-10-CM | POA: Diagnosis not present

## 2020-12-14 ENCOUNTER — Ambulatory Visit: Payer: Self-pay | Admitting: Physician Assistant

## 2020-12-16 ENCOUNTER — Other Ambulatory Visit: Payer: Self-pay | Admitting: Obstetrics and Gynecology

## 2020-12-27 ENCOUNTER — Ambulatory Visit: Payer: BC Managed Care – PPO

## 2020-12-30 ENCOUNTER — Other Ambulatory Visit (HOSPITAL_BASED_OUTPATIENT_CLINIC_OR_DEPARTMENT_OTHER): Payer: Self-pay | Admitting: Obstetrics and Gynecology

## 2020-12-30 DIAGNOSIS — Z1231 Encounter for screening mammogram for malignant neoplasm of breast: Secondary | ICD-10-CM

## 2021-01-02 ENCOUNTER — Ambulatory Visit (HOSPITAL_BASED_OUTPATIENT_CLINIC_OR_DEPARTMENT_OTHER)
Admission: RE | Admit: 2021-01-02 | Discharge: 2021-01-02 | Disposition: A | Discharge status: Home / Self Care | Payer: BC Managed Care – PPO | Source: Ambulatory Visit | Attending: Obstetrics and Gynecology | Admitting: Obstetrics and Gynecology

## 2021-01-02 ENCOUNTER — Other Ambulatory Visit: Payer: Self-pay

## 2021-01-02 DIAGNOSIS — Z1231 Encounter for screening mammogram for malignant neoplasm of breast: Secondary | ICD-10-CM | POA: Insufficient documentation

## 2021-01-02 IMAGING — MG MM DIGITAL SCREENING BILAT W/ TOMO AND CAD
8 series · 8 of 24 positions shown · non-contrast
Comparison: Previous exam(s).

CLINICAL DATA: Screening.

EXAM:
DIGITAL SCREENING BILATERAL MAMMOGRAM WITH TOMOSYNTHESIS AND CAD
TECHNIQUE: Bilateral screening digital craniocaudal and mediolateral oblique
mammograms were obtained. Bilateral screening digital breast
tomosynthesis was performed. The images were evaluated with
computer-aided detection.

[R MLO synth-2D]
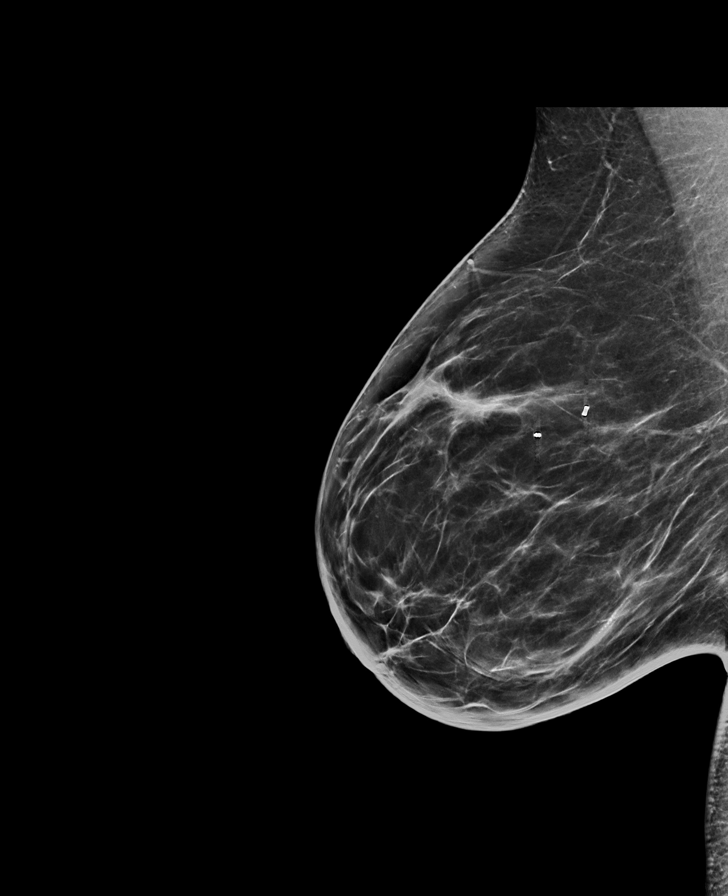

[L CC synth-2D]
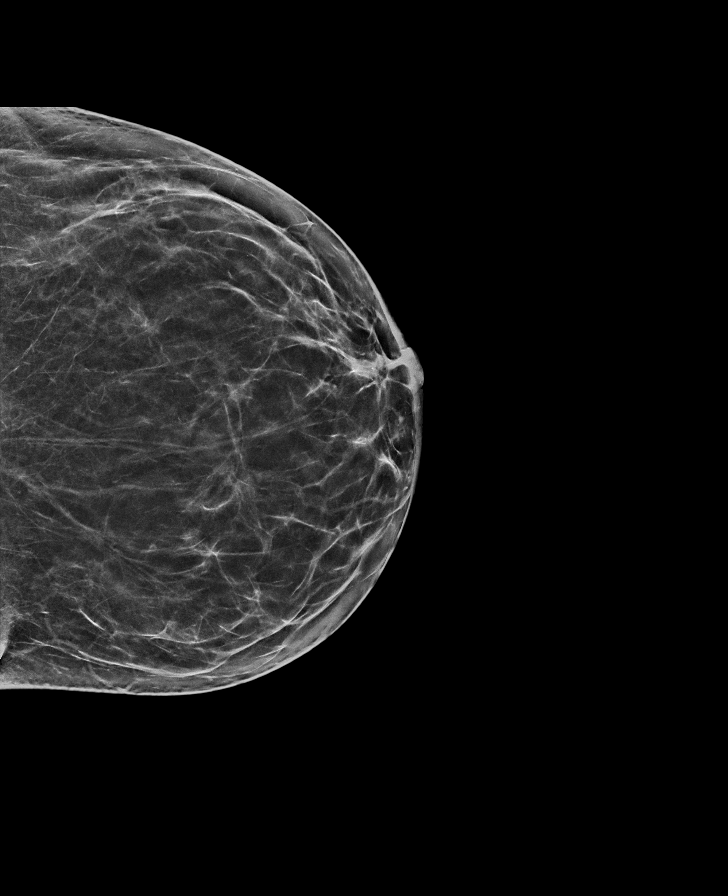

[R CC synth-2D]
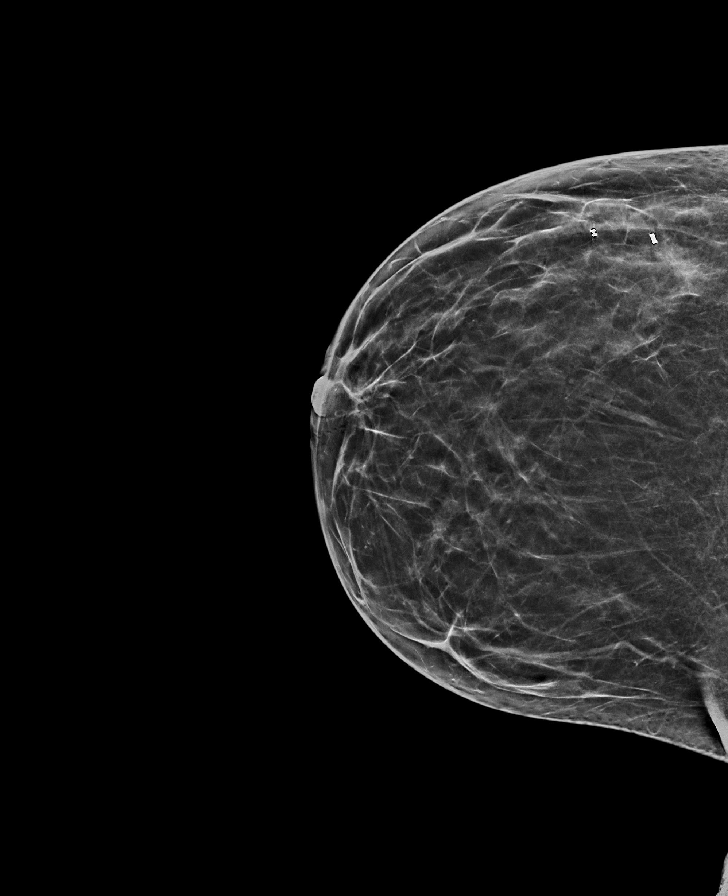

[L MLO synth-2D]
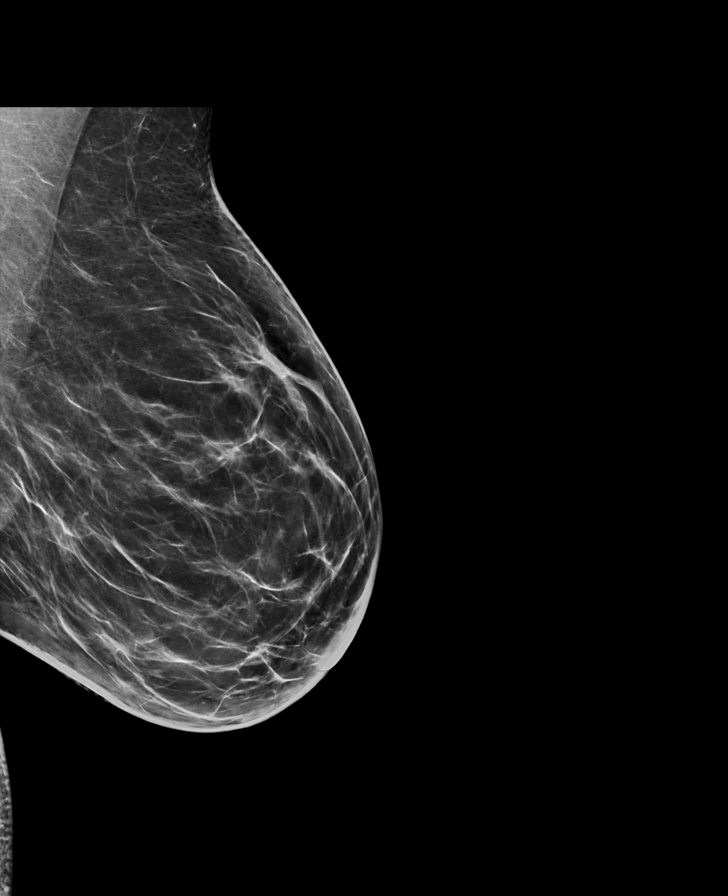

[L CC tomo · tomo slice 31/62.0]
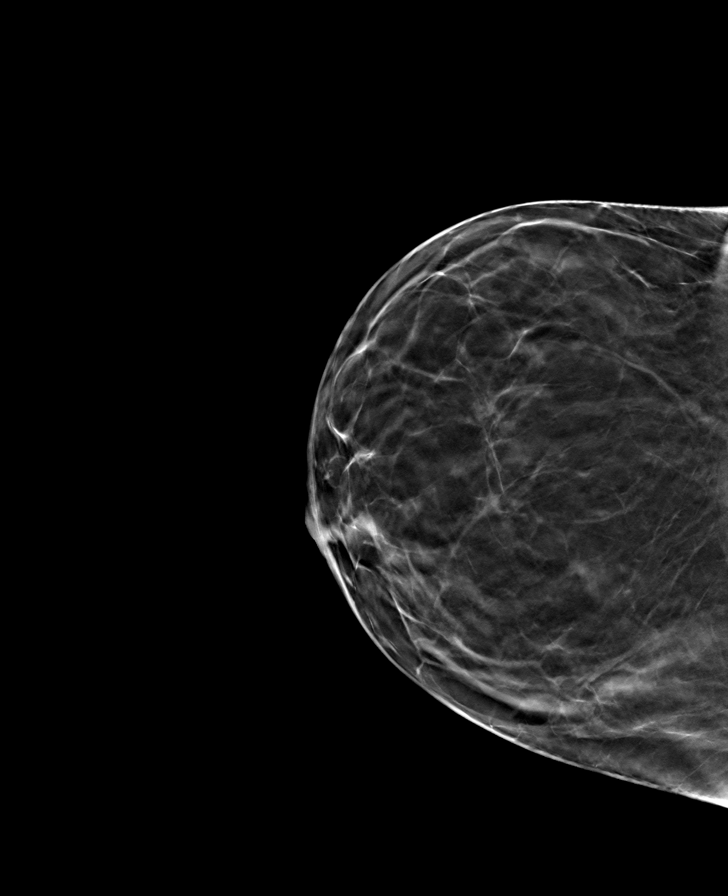

[R CC tomo · tomo slice 28/55.0]
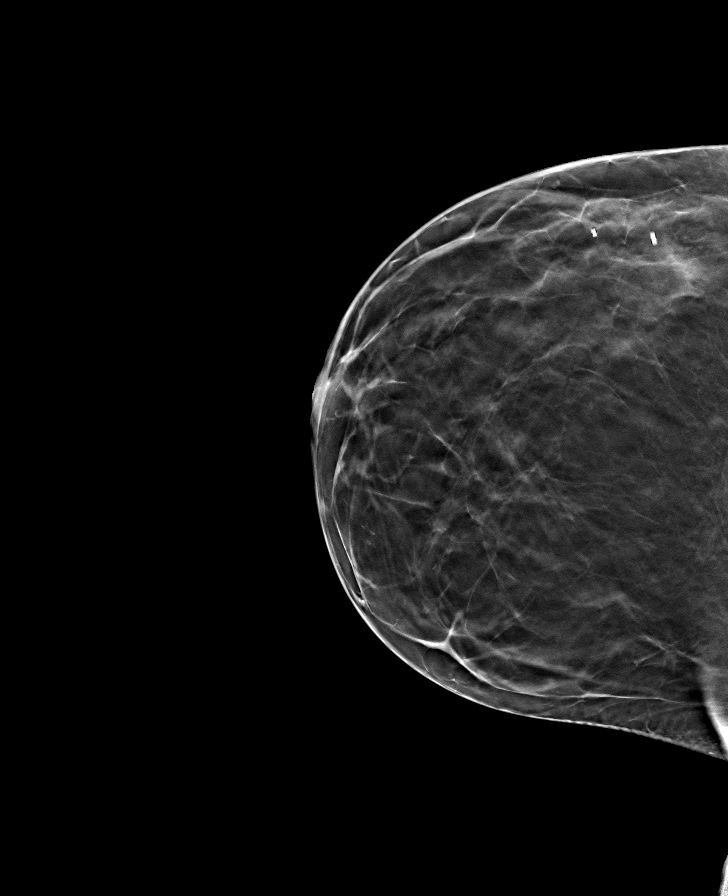

[L MLO tomo · tomo slice 39/76.0]
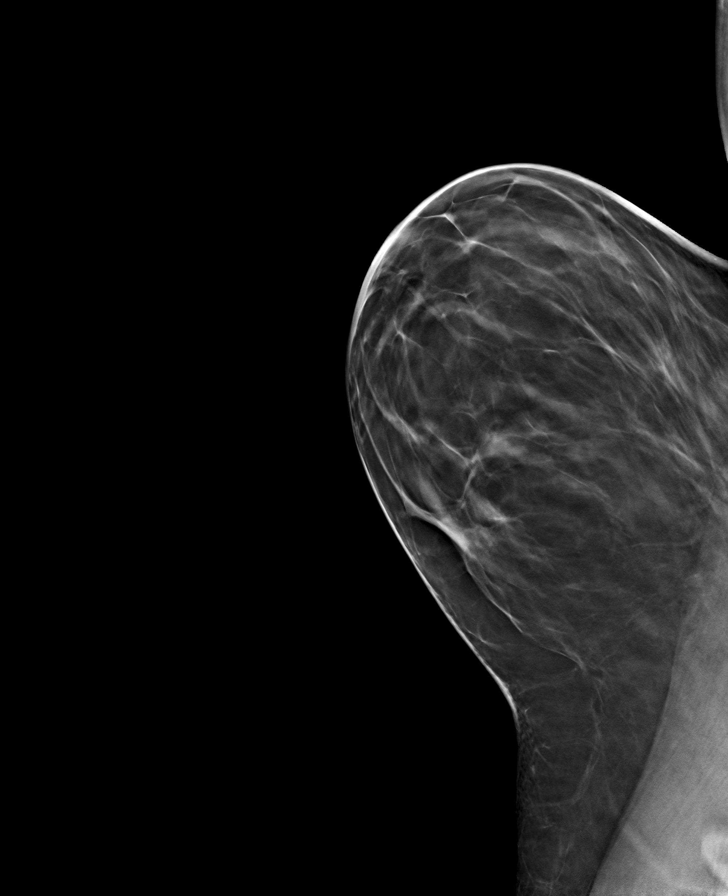

[R MLO tomo · tomo slice 36/71.0]
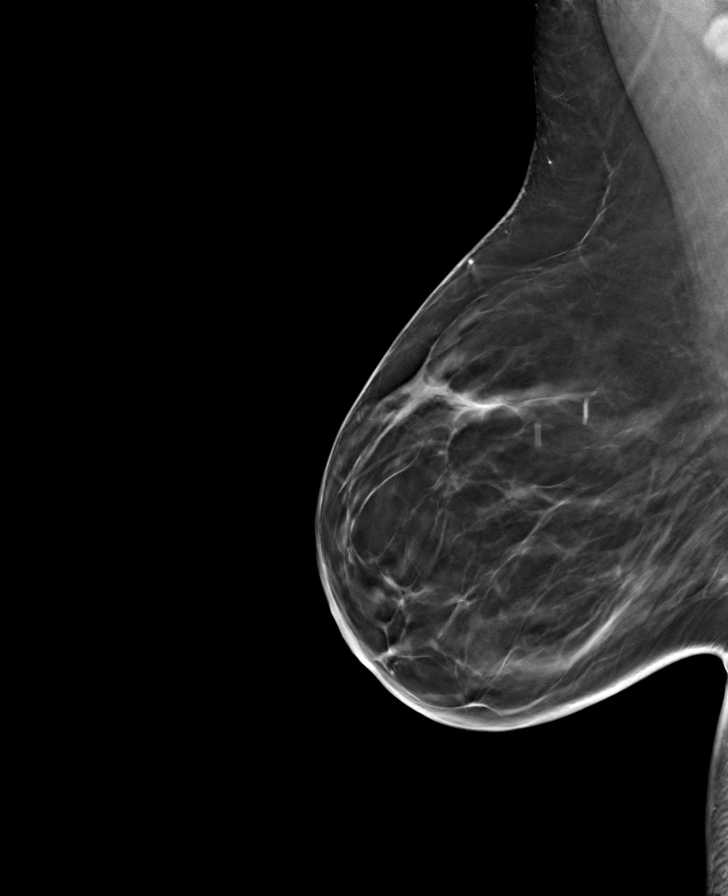

[8 of 24 positions shown; findings below may reference images not displayed]

ACR Breast Density Category b: There are scattered areas of
fibroglandular density.
FINDINGS: There are no findings suspicious for malignancy.
IMPRESSION: No mammographic evidence of malignancy. A result letter of this
screening mammogram will be mailed directly to the patient.

RECOMMENDATION:
Screening mammogram in one year. (Code:[BY])

BI-RADS CATEGORY  1: Negative.

## 2021-01-19 ENCOUNTER — Encounter: Payer: Self-pay | Admitting: Family Medicine

## 2021-01-19 ENCOUNTER — Ambulatory Visit: Payer: BC Managed Care – PPO | Admitting: Family Medicine

## 2021-01-19 ENCOUNTER — Other Ambulatory Visit: Payer: Self-pay

## 2021-01-19 VITALS — BP 116/74 | HR 87 | Temp 97.7°F | Resp 16 | Ht 62.0 in | Wt 178.0 lb

## 2021-01-19 DIAGNOSIS — Z1159 Encounter for screening for other viral diseases: Secondary | ICD-10-CM | POA: Diagnosis not present

## 2021-01-19 DIAGNOSIS — Z0000 Encounter for general adult medical examination without abnormal findings: Secondary | ICD-10-CM | POA: Diagnosis not present

## 2021-01-19 DIAGNOSIS — E669 Obesity, unspecified: Secondary | ICD-10-CM | POA: Diagnosis not present

## 2021-01-19 DIAGNOSIS — Z6832 Body mass index (BMI) 32.0-32.9, adult: Secondary | ICD-10-CM

## 2021-01-19 DIAGNOSIS — R5381 Other malaise: Secondary | ICD-10-CM

## 2021-01-19 DIAGNOSIS — R5383 Other fatigue: Secondary | ICD-10-CM

## 2021-01-19 DIAGNOSIS — R7989 Other specified abnormal findings of blood chemistry: Secondary | ICD-10-CM

## 2021-01-19 NOTE — Progress Notes (Incomplete)
New patient visit   Patient: Kayla West   DOB: 05-07-1976   45 y.o. Female  MRN: [id number] Visit Date: 01/19/2021  Today's healthcare provider: Lavon Paganini, MD   Chief Complaint  Patient presents with  . New Patient (Initial Visit)   Subjective    Kayla West is a 45 y.o. female who presents today as a new patient to establish care.  HPI  ***  H/o positive ANA and saw Rheum with normal workup.  History of elevated TSH, but normal T4 with no medications.    Past Medical History:  Diagnosis Date  . Abnormal TSH 06/07/2017   3.8 on NOB labs and started on Synthroid. Recheck late August.  . BRCA negative 04/2020   MyRisk neg  . 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 Endometrial polyp 12/08/2019  . Family history of breast cancer   . Family history of pancreatic cancer   . History of pre-eclampsia 07/01/2017  . Increased risk of breast cancer 04/2020   IBIS=24.9%/riskscore=16.5%  . PONV (postoperative nausea and vomiting)   . Positive ANA (antinuclear antibody) 02/22/2014   Sees Rheumatology, no meds  . Symptomatic cholelithiasis    Past Surgical History:  Procedure Laterality Date  . BREAST LUMPECTOMY Left 2010   benign indication  . CESAREAN SECTION     2019, 2015  . CHOLECYSTECTOMY  10/20/2020   gallstones  . DENTAL SURGERY    . DILATATION & CURETTAGE/HYSTEROSCOPY WITH MYOSURE N/A 12/08/2019   Procedure: DILATATION & CURETTAGE/HYSTEROSCOPY WITH MYOSURE POLYPECOMTY;  Surgeon: Will Bonnet, MD;  Location: [facility] ORS;  Service: Gynecology;  Laterality: N/A;   Family Status  Relation Name Status  . Mother  Deceased  . [employer]  . MGM  Deceased  . [employer]  . PGM  (Not Specified)  . Neg Hx  (Not Specified)   Family History  Problem Relation Age of Onset  . Pancreatic cancer Mother 15  . Breast cancer Maternal Aunt        twice-50s and 53s  . Breast cancer Maternal Grandmother        twice- 66s and 79s  . Breast  cancer Maternal Aunt 77  . Diabetes Paternal Grandmother   . Stroke Paternal Grandmother   . Colon cancer Neg Hx    Social History   Socioeconomic History  . Marital status: Married    Spouse name: Jeneen Rinks  . Number of children: 3  . Years of education: Not on file  . Highest education level: Not on file  Occupational History  . Occupation: professor  Tobacco Use  . Smoking status: Never Smoker  . Smokeless tobacco: Never Used  Vaping Use  . Vaping Use: Never used  Substance and Sexual Activity  . Alcohol use: Not Currently  . Drug use: Never  . Sexual activity: Yes    Partners: Male    Birth control/protection: None    Comment: Vasectomy  Other Topics Concern  . Not on file  Social History Narrative   Patient lives with husband and 3 children.  Feels safe in her home   Social Determinants of Health   Financial Resource Strain: Not on file  Food Insecurity: Not on file  Transportation Needs: Not on file  Physical Activity: Not on file  Stress: Not on file  Social Connections: Not on file   Outpatient Medications Prior to Visit  Medication Sig  . Multiple Vitamin (MULTIVITAMIN WITH MINERALS) TABS tablet Take 1  tablet by mouth 4 (four) times a week.  . Probiotic Product (PROBIOTIC PO) Take 1 capsule by mouth daily.  . [DISCONTINUED] ibuprofen (ADVIL) 600 MG tablet Take 1 tablet (600 mg total) by mouth every 8 (eight) hours as needed for mild pain or moderate pain. (Patient not taking: No sig reported)  . [DISCONTINUED] norgestimate-ethinyl estradiol (ORTHO-CYCLEN) 0.25-35 MG-MCG tablet TAKE 1 TABLET BY MOUTH EVERY DAY (Patient not taking: Reported on 01/19/2021)  . [DISCONTINUED] omeprazole (PRILOSEC) 40 MG capsule TAKE 1 CAPSULE (40 MG TOTAL) BY MOUTH 2 (TWO) TIMES DAILY BEFORE A MEAL. (Patient not taking: Reported on 11/04/2020)  . [DISCONTINUED] sucralfate (CARAFATE) 1 GM/10ML suspension Take 10 mLs (1 g total) by mouth 4 (four) times daily. (Patient not taking: Reported  on 11/04/2020)   No facility-administered medications prior to visit.   No Known Allergies  Immunization History  Administered Date(s) Administered  . Influenza Inj Mdck Quad Pf 09/27/2014, 01/17/2016, 10/08/2017  . Influenza,inj,Quad PF,6+ Mos 09/01/2019, 09/02/2020  . Influenza-Unspecified 09/27/2014, 01/17/2016, 10/08/2017  . PFIZER(Purple Top)SARS-COV-2 Vaccination 01/21/2020, 02/11/2020  . Tdap 08/16/2014, 11/07/2017    Health Maintenance  Topic Date Due  . Hepatitis C Screening  Never done  . COVID-19 Vaccine (3 - Booster for Pfizer series) 08/13/2020  . HIV Screening  01/19/2022 (Originally 06/27/1993)  . PAP SMEAR-Modifier  09/02/2022  . TETANUS/TDAP  11/08/2027  . INFLUENZA VACCINE  Completed  . HPV VACCINES  Aged Out    Patient Care Team: Bacigalupo, Dionne Bucy, MD as PCP - General (Family Medicine)  Review of Systems  Constitutional: Negative.   HENT: Negative.   Eyes: Negative.   Respiratory: Negative.   Cardiovascular: Negative.   Gastrointestinal: Negative.   Endocrine: Negative.   Genitourinary: Negative.   Musculoskeletal: Negative.   Skin: Negative.   Allergic/Immunologic: Negative.   Neurological: Negative.   Hematological: Negative.   Psychiatric/Behavioral: Negative.     {Labs  Heme  Chem  Endocrine  Serology  Results Review (optional):[id number]::" "}  Objective    BP 116/74 (BP Location: Left Arm, Patient Position: Sitting, Cuff Size: Normal)   Pulse 87   Temp 97.7 F (36.5 C) (Temporal)   Resp 16   Ht _0  (1.575 m)   Wt 178 lb (80.7 kg)   LMP 01/04/2021   BMI 32.56 kg/m  Physical Exam Vitals reviewed.  Constitutional:      General: She is not in acute distress.    Appearance: Normal appearance. She is well-developed. She is not diaphoretic.  HENT:     Head: Normocephalic and atraumatic.     Right Ear: Tympanic membrane, ear canal and external ear normal.     Left Ear: Tympanic membrane, ear canal and external ear normal.      Nose: Nose normal.     Mouth/Throat:     Mouth: Mucous membranes are moist.     Pharynx: Oropharynx is clear. No oropharyngeal exudate.  Eyes:     General: No scleral icterus.    Conjunctiva/sclera: Conjunctivae normal.     Pupils: Pupils are equal, round, and reactive to light.  Neck:     Thyroid: No thyromegaly.  Cardiovascular:     Rate and Rhythm: Normal rate and regular rhythm.     Pulses: Normal pulses.     Heart sounds: Normal heart sounds. No murmur heard.   Pulmonary:     Effort: Pulmonary effort is normal. No respiratory distress.     Breath sounds: Normal breath sounds. No wheezing or rales.  Abdominal:  General: There is no distension.     Palpations: Abdomen is soft.     Tenderness: There is no abdominal tenderness.  Musculoskeletal:        General: No deformity.     Cervical back: Neck supple.     Right lower leg: No edema.     Left lower leg: No edema.  Lymphadenopathy:     Cervical: No cervical adenopathy.  Skin:    General: Skin is warm and dry.     Findings: No rash.  Neurological:     Mental Status: She is alert and oriented to person, place, and time. Mental status is at baseline.     Sensory: No sensory deficit.     Motor: No weakness.     Gait: Gait normal.  Psychiatric:        Mood and Affect: Mood normal.        Behavior: Behavior normal.        Thought Content: Thought content normal.      Depression Screen PHQ 2/9 Scores 01/19/2021 01/19/2021 09/03/2019  PHQ - 2 Score 0 0 0  PHQ- 9 Score _0 No results found for any visits on 01/19/21.  Assessment & Plan       Health Maintenance  Topic Date Due  . Hepatitis C Screening  Never done  . COVID-19 Vaccine (3 - Booster for Pfizer series) 08/13/2020  . HIV Screening  01/19/2022 (Originally 06/27/1993)  . PAP SMEAR-Modifier  09/02/2022  . TETANUS/TDAP  11/08/2027  . INFLUENZA VACCINE  Completed  . HPV VACCINES  Aged Out    Immunization History  Administered Date(s) Administered   . Influenza Inj Mdck Quad Pf 09/27/2014, 01/17/2016, 10/08/2017  . Influenza,inj,Quad PF,6+ Mos 09/01/2019, 09/02/2020  . Influenza-Unspecified 09/27/2014, 01/17/2016, 10/08/2017  . PFIZER(Purple Top)SARS-COV-2 Vaccination 01/21/2020, 02/11/2020  . Tdap 08/16/2014, 11/07/2017   Problem List Items Addressed This Visit      Other   Abnormal TSH    Recheck TSH Reflex free T4 if abnormal      Relevant Orders   TSH   Malaise and fatigue    Longstanding issues with lifestyle factors that affect it Check labs to see if there are any underlying causes      Relevant Orders   TSH   Comprehensive metabolic panel   CBC   O35   VITAMIN D 25 Hydroxy (Vit-D Deficiency, Fractures)   Class 1 obesity without serious comorbidity with body mass index (BMI) of 32.0 to 32.9 in adult    Discussed importance of healthy weight management Discussed diet and exercise  Screening labs      Relevant Orders   TSH   Comprehensive metabolic panel   Lipid panel   CBC   Hemoglobin A1c    Other Visit Diagnoses    Encounter for annual physical exam    -  Primary   Relevant Orders   TSH   Comprehensive metabolic panel   Lipid panel   CBC   B12   VITAMIN D 25 Hydroxy (Vit-D Deficiency, Fractures)   Hepatitis C Antibody   Hemoglobin A1c   Need for hepatitis C screening test       Relevant Orders   Hepatitis C Antibody       Return in about 1 year (around 01/19/2022) for CPE.     I, Lavon Paganini, MD, have reviewed all documentation for this visit. The documentation on 01/19/21 for the exam, diagnosis, procedures, and orders are all accurate  and complete.   Bacigalupo, Dionne Bucy, MD, MPH [facility]

## 2021-01-19 NOTE — Assessment & Plan Note (Signed)
Recheck TSH Reflex free T4 if abnormal

## 2021-01-19 NOTE — Assessment & Plan Note (Signed)
Longstanding issues with lifestyle factors that affect it Check labs to see if there are any underlying causes

## 2021-01-19 NOTE — Assessment & Plan Note (Signed)
Discussed importance of healthy weight management Discussed diet and exercise  Screening labs 

## 2021-01-19 NOTE — Patient Instructions (Signed)
Preventive Care 43-43 Years Old, Female Preventive care refers to lifestyle choices and visits with your health care provider that can promote health and wellness. This includes:  A yearly physical exam. This is also called an annual wellness visit.  Regular dental and eye exams.  Immunizations.  Screening for certain conditions.  Healthy lifestyle choices, such as: ? Eating a healthy diet. ? Getting regular exercise. ? Not using drugs or products that contain nicotine and tobacco. ? Limiting alcohol use. What can I expect for my preventive care visit? Physical exam Your health care provider will check your:  Height and weight. These may be used to calculate your BMI (body mass index). BMI is a measurement that tells if you are at a healthy weight.  Heart rate and blood pressure.  Body temperature.  Skin for abnormal spots. Counseling Your health care provider may ask you questions about your:  Past medical problems.  Family's medical history.  Alcohol, tobacco, and drug use.  Emotional well-being.  Home life and relationship well-being.  Sexual activity.  Diet, exercise, and sleep habits.  Work and work Statistician.  Access to firearms.  Method of birth control.  Menstrual cycle.  Pregnancy history. What immunizations do I need? Vaccines are usually given at various ages, according to a schedule. Your health care provider will recommend vaccines for you based on your age, medical history, and lifestyle or other factors, such as travel or where you work.   What tests do I need? Blood tests  Lipid and cholesterol levels. These may be checked every 5 years, or more often if you are over 43 years old.  Hepatitis C test.  Hepatitis B test. Screening  Lung cancer screening. You may have this screening every year starting at age 43 if you have a 30-pack-year history of smoking and currently smoke or have quit within the past 15 years.  Colorectal cancer  screening. ? All adults should have this screening starting at age 43 and continuing until age 17. ? Your health care provider may recommend screening at age 43 if you are at increased risk. ? You will have tests every 1-10 years, depending on your results and the type of screening test.  Diabetes screening. ? This is done by checking your blood sugar (glucose) after you have not eaten for a while (fasting). ? You may have this done every 1-3 years.  Mammogram. ? This may be done every 1-2 years. ? Talk with your health care provider about when you should start having regular mammograms. This may depend on whether you have a family history of breast cancer.  BRCA-related cancer screening. This may be done if you have a family history of breast, ovarian, tubal, or peritoneal cancers.  Pelvic exam and Pap test. ? This may be done every 3 years starting at age 10. ? Starting at age 11, this may be done every 5 years if you have a Pap test in combination with an HPV test. Other tests  STD (sexually transmitted disease) testing, if you are at risk.  Bone density scan. This is done to screen for osteoporosis. You may have this scan if you are at high risk for osteoporosis. Talk with your health care provider about your test results, treatment options, and if necessary, the need for more tests. Follow these instructions at home: Eating and drinking  Eat a diet that includes fresh fruits and vegetables, whole grains, lean protein, and low-fat dairy products.  Take vitamin and mineral supplements  as recommended by your health care provider.  Do not drink alcohol if: ? Your health care provider tells you not to drink. ? You are pregnant, may be pregnant, or are planning to become pregnant.  If you drink alcohol: ? Limit how much you have to 0-1 drink a day. ? Be aware of how much alcohol is in your drink. In the [place] one drink equals one 12 oz bottle of beer (355 mL), one 5 oz glass of  wine (148 mL), or one 1 oz glass of hard liquor (44 mL).   Lifestyle  Take daily care of your teeth and gums. Brush your teeth every morning and night with fluoride toothpaste. Floss one time each day.  Stay active. Exercise for at least 30 minutes 5 or more days each week.  Do not use any products that contain nicotine or tobacco, such as cigarettes, e-cigarettes, and chewing tobacco. If you need help quitting, ask your health care provider.  Do not use drugs.  If you are sexually active, practice safe sex. Use a condom or other form of protection to prevent STIs (sexually transmitted infections).  If you do not wish to become pregnant, use a form of birth control. If you plan to become pregnant, see your health care provider for a prepregnancy visit.  If told by your health care provider, take low-dose aspirin daily starting at age 43.  Find healthy ways to cope with stress, such as: ? Meditation, yoga, or listening to music. ? Journaling. ? Talking to a trusted person. ? Spending time with friends and family. Safety  Always wear your seat belt while driving or riding in a vehicle.  Do not drive: ? If you have been drinking alcohol. Do not ride with someone who has been drinking. ? When you are tired or distracted. ? While texting.  Wear a helmet and other protective equipment during sports activities.  If you have firearms in your house, make sure you follow all gun safety procedures. What's next?  Visit your health care provider once a year for an annual wellness visit.  Ask your health care provider how often you should have your eyes and teeth checked.  Stay up to date on all vaccines. This information is not intended to replace advice given to you by your health care provider. Make sure you discuss any questions you have with your health care provider. Document Revised: 08/09/2020 Document Reviewed: 07/17/2018 Elsevier Patient Education  2021 [employer]  

## 2021-01-19 NOTE — Progress Notes (Signed)
New patient visit   Patient: Kayla West   DOB: 23-Oct-1976   45 y.o. Female  MRN: [id number] Visit Date: 01/19/2021  Today's healthcare provider: Lavon Paganini, MD   Chief Complaint  Patient presents with   New Patient (Initial Visit)   Subjective    Kayla West is a 45 y.o. female who presents today as a new patient to establish care.  HPI   H/o positive ANA and saw Rheum with normal workup.  History of elevated TSH, but normal T4 with no medications.  Past Medical History:  Diagnosis Date   Abnormal TSH 06/07/2017   3.8 on NOB labs and started on Synthroid. Recheck late August.   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   Endometrial polyp 12/08/2019   Family history of breast cancer    Family history of pancreatic cancer    History of pre-eclampsia 07/01/2017   Increased risk of breast cancer 04/2020   IBIS=24.9%/riskscore=16.5%   PONV (postoperative nausea and vomiting)    Positive ANA (antinuclear antibody) 02/22/2014   Sees Rheumatology, no meds   Symptomatic cholelithiasis    Past Surgical History:  Procedure Laterality Date   BREAST LUMPECTOMY Left 2010   benign indication   CESAREAN SECTION     2019, 2015   CHOLECYSTECTOMY  10/20/2020   gallstones   [place] N/A 12/08/2019   Procedure: DILATATION & CURETTAGE/HYSTEROSCOPY WITH MYOSURE POLYPECOMTY;  Surgeon: Will Bonnet, MD;  Location: [facility] ORS;  Service: Gynecology;  Laterality: N/A;   Family Status  Relation Name Status   Mother  Deceased   Mat Aunt  Alive   MGM  Deceased   Mat Aunt  Alive   PGM  (Not Specified)   Neg Hx  (Not Specified)   Family History  Problem Relation Age of Onset   Pancreatic cancer Mother 42   Breast cancer Maternal Aunt        twice-50s and 9s   Breast cancer Maternal Grandmother        twice- 16s and 61s   Breast cancer  Maternal Aunt 77   Diabetes Paternal Grandmother    Stroke Paternal Grandmother    Colon cancer Neg Hx    Social History   Socioeconomic History   Marital status: Married    Spouse name: james   Number of children: 3   Years of education: Not on file   Highest education level: Not on file  Occupational History   Occupation: professor  Tobacco Use   Smoking status: Never Smoker   Smokeless tobacco: Never Used  Scientific laboratory technician Use: Never used  Substance and Sexual Activity   Alcohol use: Not Currently   Drug use: Never   Sexual activity: Yes    Partners: Male    Birth control/protection: None    Comment: Vasectomy  Other Topics Concern   Not on file  Social History Narrative   Patient lives with husband and 3 children.  Feels safe in her home   Social Determinants of Health   Financial Resource Strain: Not on file  Food Insecurity: Not on file  Transportation Needs: Not on file  Physical Activity: Not on file  Stress: Not on file  Social Connections: Not on file   Outpatient Medications Prior to Visit  Medication Sig   Multiple Vitamin (MULTIVITAMIN WITH MINERALS) TABS tablet Take 1 tablet by mouth  4 (four) times a week.   Probiotic Product (PROBIOTIC PO) Take 1 capsule by mouth daily.   [DISCONTINUED] ibuprofen (ADVIL) 600 MG tablet Take 1 tablet (600 mg total) by mouth every 8 (eight) hours as needed for mild pain or moderate pain. (Patient not taking: No sig reported)   [DISCONTINUED] norgestimate-ethinyl estradiol (ORTHO-CYCLEN) 0.25-35 MG-MCG tablet TAKE 1 TABLET BY MOUTH EVERY DAY (Patient not taking: Reported on 01/19/2021)   [DISCONTINUED] omeprazole (PRILOSEC) 40 MG capsule TAKE 1 CAPSULE (40 MG TOTAL) BY MOUTH 2 (TWO) TIMES DAILY BEFORE A MEAL. (Patient not taking: Reported on 11/04/2020)   [DISCONTINUED] sucralfate (CARAFATE) 1 GM/10ML suspension Take 10 mLs (1 g total) by mouth 4 (four) times daily. (Patient not taking: Reported on  11/04/2020)   No facility-administered medications prior to visit.   No Known Allergies  Immunization History  Administered Date(s) Administered   Influenza Inj Mdck Quad Pf 09/27/2014, 01/17/2016, 10/08/2017   Influenza,inj,Quad PF,6+ Mos 09/01/2019, 09/02/2020   Influenza-Unspecified 09/27/2014, 01/17/2016, 10/08/2017   PFIZER(Purple Top)SARS-COV-2 Vaccination 01/21/2020, 02/11/2020   Tdap 08/16/2014, 11/07/2017    Health Maintenance  Topic Date Due   COVID-19 Vaccine (3 - Booster for Pfizer series) 08/13/2020   HIV Screening  01/19/2022 (Originally 06/27/1993)   PAP SMEAR-Modifier  09/02/2022   TETANUS/TDAP  11/08/2027   INFLUENZA VACCINE  Completed   Hepatitis C Screening  Completed   HPV VACCINES  Aged Out    Patient Care Team: Virginia Crews, MD as PCP - General (Family Medicine)  Review of Systems  Constitutional: Negative.   HENT: Negative.   Eyes: Negative.   Respiratory: Negative.   Cardiovascular: Negative.   Gastrointestinal: Negative.   Endocrine: Negative.   Genitourinary: Negative.   Musculoskeletal: Negative.   Skin: Negative.   Allergic/Immunologic: Negative.   Neurological: Negative.   Hematological: Negative.   Psychiatric/Behavioral: Negative.       Objective    BP 116/74 (BP Location: Left Arm, Patient Position: Sitting, Cuff Size: Normal)    Pulse 87    Temp 97.7 F (36.5 C) (Temporal)    Resp 16    Ht _0  (1.575 m)    Wt 178 lb (80.7 kg)    LMP 01/04/2021    BMI 32.56 kg/m  Physical Exam Vitals reviewed.  Constitutional:      General: She is not in acute distress.    Appearance: Normal appearance. She is well-developed. She is not diaphoretic.  HENT:     Head: Normocephalic and atraumatic.     Right Ear: Tympanic membrane, ear canal and external ear normal.     Left Ear: Tympanic membrane, ear canal and external ear normal.     Nose: Nose normal.     Mouth/Throat:     Mouth: Mucous membranes are moist.      Pharynx: Oropharynx is clear. No oropharyngeal exudate.  Eyes:     General: No scleral icterus.    Conjunctiva/sclera: Conjunctivae normal.     Pupils: Pupils are equal, round, and reactive to light.  Neck:     Thyroid: No thyromegaly.  Cardiovascular:     Rate and Rhythm: Normal rate and regular rhythm.     Pulses: Normal pulses.     Heart sounds: Normal heart sounds. No murmur heard.   Pulmonary:     Effort: Pulmonary effort is normal. No respiratory distress.     Breath sounds: Normal breath sounds. No wheezing or rales.  Abdominal:     General: There is no distension.  Palpations: Abdomen is soft.     Tenderness: There is no abdominal tenderness.  Musculoskeletal:        General: No deformity.     Cervical back: Neck supple.     Right lower leg: No edema.     Left lower leg: No edema.  Lymphadenopathy:     Cervical: No cervical adenopathy.  Skin:    General: Skin is warm and dry.     Findings: No rash.  Neurological:     Mental Status: She is alert and oriented to person, place, and time. Mental status is at baseline.     Sensory: No sensory deficit.     Motor: No weakness.     Gait: Gait normal.  Psychiatric:        Mood and Affect: Mood normal.        Behavior: Behavior normal.        Thought Content: Thought content normal.      Depression Screen PHQ 2/9 Scores 01/19/2021 01/19/2021 09/03/2019  PHQ - 2 Score 0 0 0  PHQ- 9 Score _0 Results for orders placed or performed in visit on 01/19/21  TSH  Result Value Ref Range   TSH 2.630 0.450 - 4.500 uIU/mL  Comprehensive metabolic panel  Result Value Ref Range   Glucose 84 65 - 99 mg/dL   BUN 8 6 - 24 mg/dL   Creatinine, Ser 0.76 0.57 - 1.00 mg/dL   eGFR 100 >59 mL/min/1.73   BUN/Creatinine Ratio 11 9 - 23   Sodium 140 134 - 144 mmol/L   Potassium 4.4 3.5 - 5.2 mmol/L   Chloride 102 96 - 106 mmol/L   CO2 23 20 - 29 mmol/L   Calcium 9.8 8.7 - 10.2 mg/dL   Total Protein 7.4 6.0 - 8.5 g/dL   Albumin  4.6 3.8 - 4.8 g/dL   Globulin, Total 2.8 1.5 - 4.5 g/dL   Albumin/Globulin Ratio 1.6 1.2 - 2.2   Bilirubin Total 0.4 0.0 - 1.2 mg/dL   Alkaline Phosphatase 74 44 - 121 IU/L   AST 18 0 - 40 IU/L   ALT 15 0 - 32 IU/L  Lipid panel  Result Value Ref Range   Cholesterol, Total 138 100 - 199 mg/dL   Triglycerides 69 0 - 149 mg/dL   HDL 46 >39 mg/dL   VLDL Cholesterol Cal 14 5 - 40 mg/dL   LDL Chol Calc (NIH) 78 0 - 99 mg/dL   Chol/HDL Ratio 3.0 0.0 - 4.4 ratio  CBC  Result Value Ref Range   WBC 6.9 3.4 - 10.8 x10E3/uL   RBC 5.14 3.77 - 5.28 x10E6/uL   Hemoglobin 12.0 11.1 - 15.9 g/dL   Hematocrit 39.0 34.0 - 46.6 %   MCV 76 (L) 79 - 97 fL   MCH 23.3 (L) 26.6 - 33.0 pg   MCHC 30.8 (L) 31.5 - 35.7 g/dL   RDW 14.9 11.7 - 15.4 %   Platelets 319 150 - 450 x10E3/uL  B12  Result Value Ref Range   Vitamin B-12 580 232 - 1,245 pg/mL  VITAMIN D 25 Hydroxy (Vit-D Deficiency, Fractures)  Result Value Ref Range   Vit D, 25-Hydroxy 31.7 30.0 - 100.0 ng/mL  Hepatitis C Antibody  Result Value Ref Range   Hep C Virus Ab <0.1 0.0 - 0.9 s/co ratio  Hemoglobin A1c  Result Value Ref Range   Hgb A1c MFr Bld 5.2 4.8 - 5.6 %   Est. average glucose  Bld gHb Est-mCnc 103 mg/dL    Assessment & Plan       Problem List Items Addressed This Visit      Other   Abnormal TSH    Recheck TSH Reflex free T4 if abnormal      Relevant Orders   TSH (Completed)   Malaise and fatigue    Longstanding issues with lifestyle factors that affect it Check labs to see if there are any underlying causes      Relevant Orders   TSH (Completed)   Comprehensive metabolic panel (Completed)   CBC (Completed)   B12 (Completed)   VITAMIN D 25 Hydroxy (Vit-D Deficiency, Fractures) (Completed)   Class 1 obesity without serious comorbidity with body mass index (BMI) of 32.0 to 32.9 in adult    Discussed importance of healthy weight management Discussed diet and exercise  Screening labs      Relevant Orders    TSH (Completed)   Comprehensive metabolic panel (Completed)   Lipid panel (Completed)   CBC (Completed)   Hemoglobin A1c (Completed)    Other Visit Diagnoses    Encounter for annual physical exam    -  Primary   Relevant Orders   TSH (Completed)   Comprehensive metabolic panel (Completed)   Lipid panel (Completed)   CBC (Completed)   B12 (Completed)   VITAMIN D 25 Hydroxy (Vit-D Deficiency, Fractures) (Completed)   Hepatitis C Antibody (Completed)   Hemoglobin A1c (Completed)   Need for hepatitis C screening test       Relevant Orders   Hepatitis C Antibody (Completed)       Health Maintenance  Topic Date Due   COVID-19 Vaccine (3 - Booster for Pfizer series) 08/13/2020   HIV Screening  01/19/2022 (Originally 06/27/1993)   PAP SMEAR-Modifier  09/02/2022   TETANUS/TDAP  11/08/2027   INFLUENZA VACCINE  Completed   Hepatitis C Screening  Completed   HPV VACCINES  Aged Out     Immunization History  Administered Date(s) Administered   Influenza Inj Mdck Quad Pf 09/27/2014, 01/17/2016, 10/08/2017   Influenza,inj,Quad PF,6+ Mos 09/01/2019, 09/02/2020   Influenza-Unspecified 09/27/2014, 01/17/2016, 10/08/2017   PFIZER(Purple Top)SARS-COV-2 Vaccination 01/21/2020, 02/11/2020   Tdap 08/16/2014, 11/07/2017    Return in about 1 year (around 01/19/2022) for CPE.     I, Lavon Paganini, MD, have reviewed all documentation for this visit. The documentation on 01/20/21 for the exam, diagnosis, procedures, and orders are all accurate and complete.   Charman Blasco, Dionne Bucy, MD, MPH [facility]

## 2021-01-20 LAB — LIPID PANEL
Chol/HDL Ratio: 3 ratio (ref 0.0–4.4)
Cholesterol, Total: 138 mg/dL (ref 100–199)
HDL: 46 mg/dL (ref >39)
LDL Chol Calc (NIH): 78 mg/dL (ref 0–99)
Triglycerides: 69 mg/dL (ref 0–149)
VLDL Cholesterol Cal: 14 mg/dL (ref 5–40)

## 2021-01-20 LAB — CBC
Hematocrit: 39 % (ref 34.0–46.6)
Hemoglobin: 12 g/dL (ref 11.1–15.9)
MCH: 23.3 pg — ABNORMAL LOW (ref 26.6–33.0)
MCHC: 30.8 g/dL — ABNORMAL LOW (ref 31.5–35.7)
MCV: 76 fL — ABNORMAL LOW (ref 79–97)
Platelets: 319 10*3/uL (ref 150–450)
RBC: 5.14 x10E6/uL (ref 3.77–5.28)
RDW: 14.9 % (ref 11.7–15.4)
WBC: 6.9 10*3/uL (ref 3.4–10.8)

## 2021-01-20 LAB — COMPREHENSIVE METABOLIC PANEL
ALT: 15 IU/L (ref 0–32)
AST: 18 IU/L (ref 0–40)
Albumin/Globulin Ratio: 1.6 (ref 1.2–2.2)
Albumin: 4.6 g/dL (ref 3.8–4.8)
Alkaline Phosphatase: 74 IU/L (ref 44–121)
BUN/Creatinine Ratio: 11 (ref 9–23)
BUN: 8 mg/dL (ref 6–24)
Bilirubin Total: 0.4 mg/dL (ref 0.0–1.2)
CO2: 23 mmol/L (ref 20–29)
Calcium: 9.8 mg/dL (ref 8.7–10.2)
Chloride: 102 mmol/L (ref 96–106)
Creatinine, Ser: 0.76 mg/dL (ref 0.57–1.00)
Globulin, Total: 2.8 g/dL (ref 1.5–4.5)
Glucose: 84 mg/dL (ref 65–99)
Potassium: 4.4 mmol/L (ref 3.5–5.2)
Sodium: 140 mmol/L (ref 134–144)
Total Protein: 7.4 g/dL (ref 6.0–8.5)
eGFR: 100 mL/min/{1.73_m2} (ref >59)

## 2021-01-20 LAB — HEMOGLOBIN A1C
Est. average glucose Bld gHb Est-mCnc: 103 mg/dL
Hgb A1c MFr Bld: 5.2 % (ref 4.8–5.6)

## 2021-01-20 LAB — VITAMIN B12: Vitamin B-12: 580 pg/mL (ref 232–1245)

## 2021-01-20 LAB — VITAMIN D 25 HYDROXY (VIT D DEFICIENCY, FRACTURES): Vit D, 25-Hydroxy: 31.7 ng/mL (ref 30.0–100.0)

## 2021-01-20 LAB — TSH: TSH: 2.63 u[IU]/mL (ref 0.450–4.500)

## 2021-01-20 LAB — HEPATITIS C ANTIBODY: Hep C Virus Ab: <0.1 s/co ratio (ref 0.0–0.9)

## 2021-01-26 DIAGNOSIS — M5416 Radiculopathy, lumbar region: Secondary | ICD-10-CM | POA: Diagnosis not present

## 2021-01-26 DIAGNOSIS — M9902 Segmental and somatic dysfunction of thoracic region: Secondary | ICD-10-CM | POA: Diagnosis not present

## 2021-01-26 DIAGNOSIS — M546 Pain in thoracic spine: Secondary | ICD-10-CM | POA: Diagnosis not present

## 2021-01-26 DIAGNOSIS — M9903 Segmental and somatic dysfunction of lumbar region: Secondary | ICD-10-CM | POA: Diagnosis not present

## 2021-02-07 DIAGNOSIS — L570 Actinic keratosis: Secondary | ICD-10-CM | POA: Diagnosis not present

## 2021-02-09 DIAGNOSIS — M546 Pain in thoracic spine: Secondary | ICD-10-CM | POA: Diagnosis not present

## 2021-02-09 DIAGNOSIS — M5416 Radiculopathy, lumbar region: Secondary | ICD-10-CM | POA: Diagnosis not present

## 2021-02-09 DIAGNOSIS — M9903 Segmental and somatic dysfunction of lumbar region: Secondary | ICD-10-CM | POA: Diagnosis not present

## 2021-02-09 DIAGNOSIS — M9902 Segmental and somatic dysfunction of thoracic region: Secondary | ICD-10-CM | POA: Diagnosis not present

## 2021-02-23 DIAGNOSIS — M9903 Segmental and somatic dysfunction of lumbar region: Secondary | ICD-10-CM | POA: Diagnosis not present

## 2021-02-23 DIAGNOSIS — M546 Pain in thoracic spine: Secondary | ICD-10-CM | POA: Diagnosis not present

## 2021-02-23 DIAGNOSIS — M5416 Radiculopathy, lumbar region: Secondary | ICD-10-CM | POA: Diagnosis not present

## 2021-02-23 DIAGNOSIS — M9902 Segmental and somatic dysfunction of thoracic region: Secondary | ICD-10-CM | POA: Diagnosis not present

## 2021-03-20 DIAGNOSIS — F4322 Adjustment disorder with anxiety: Secondary | ICD-10-CM | POA: Diagnosis not present

## 2021-03-30 DIAGNOSIS — M9903 Segmental and somatic dysfunction of lumbar region: Secondary | ICD-10-CM | POA: Diagnosis not present

## 2021-03-30 DIAGNOSIS — M546 Pain in thoracic spine: Secondary | ICD-10-CM | POA: Diagnosis not present

## 2021-03-30 DIAGNOSIS — M5416 Radiculopathy, lumbar region: Secondary | ICD-10-CM | POA: Diagnosis not present

## 2021-03-30 DIAGNOSIS — M9902 Segmental and somatic dysfunction of thoracic region: Secondary | ICD-10-CM | POA: Diagnosis not present

## 2021-04-03 ENCOUNTER — Ambulatory Visit
Admission: EM | Admit: 2021-04-03 | Discharge: 2021-04-03 | Discharge status: Absent without leave | Payer: BC Managed Care – PPO

## 2021-04-13 DIAGNOSIS — M9903 Segmental and somatic dysfunction of lumbar region: Secondary | ICD-10-CM | POA: Diagnosis not present

## 2021-04-13 DIAGNOSIS — M9902 Segmental and somatic dysfunction of thoracic region: Secondary | ICD-10-CM | POA: Diagnosis not present

## 2021-04-13 DIAGNOSIS — M546 Pain in thoracic spine: Secondary | ICD-10-CM | POA: Diagnosis not present

## 2021-04-13 DIAGNOSIS — M5416 Radiculopathy, lumbar region: Secondary | ICD-10-CM | POA: Diagnosis not present

## 2021-04-21 ENCOUNTER — Other Ambulatory Visit: Payer: Self-pay | Admitting: Obstetrics and Gynecology

## 2021-04-21 DIAGNOSIS — Z1231 Encounter for screening mammogram for malignant neoplasm of breast: Secondary | ICD-10-CM

## 2021-04-28 ENCOUNTER — Other Ambulatory Visit: Payer: Self-pay | Admitting: *Deleted

## 2021-04-28 ENCOUNTER — Telehealth: Payer: Self-pay | Admitting: Family Medicine

## 2021-04-28 DIAGNOSIS — E611 Iron deficiency: Secondary | ICD-10-CM | POA: Diagnosis not present

## 2021-04-28 DIAGNOSIS — R5381 Other malaise: Secondary | ICD-10-CM | POA: Diagnosis not present

## 2021-04-28 DIAGNOSIS — M9903 Segmental and somatic dysfunction of lumbar region: Secondary | ICD-10-CM | POA: Diagnosis not present

## 2021-04-28 DIAGNOSIS — M546 Pain in thoracic spine: Secondary | ICD-10-CM | POA: Diagnosis not present

## 2021-04-28 DIAGNOSIS — R5383 Other fatigue: Secondary | ICD-10-CM | POA: Diagnosis not present

## 2021-04-28 DIAGNOSIS — M9902 Segmental and somatic dysfunction of thoracic region: Secondary | ICD-10-CM | POA: Diagnosis not present

## 2021-04-28 DIAGNOSIS — M5416 Radiculopathy, lumbar region: Secondary | ICD-10-CM | POA: Diagnosis not present

## 2021-04-28 NOTE — Telephone Encounter (Signed)
Pt is calling to requesting an order to test B-12 and A1C. Pt is requesting if the order can be placed today. Pt would like to come in today because she has childcare. [phone]

## 2021-04-28 NOTE — Telephone Encounter (Signed)
Per last lab result, ordered cbc, iron panel, and also a vitamin B12 (pt request). Patient was advised.

## 2021-04-29 LAB — IRON,TIBC AND FERRITIN PANEL
Ferritin: 18 ng/mL (ref 15–150)
Iron Saturation: 14 % — ABNORMAL LOW (ref 15–55)
Iron: 58 ug/dL (ref 27–159)
Total Iron Binding Capacity: 421 ug/dL (ref 250–450)
UIBC: 363 ug/dL (ref 131–425)

## 2021-04-29 LAB — CBC WITH DIFFERENTIAL/PLATELET
Basophils Absolute: 0.1 10*3/uL (ref 0.0–0.2)
Basos: 1 %
EOS (ABSOLUTE): 0.1 10*3/uL (ref 0.0–0.4)
Eos: 1 %
Hematocrit: 41.1 % (ref 34.0–46.6)
Hemoglobin: 12.9 g/dL (ref 11.1–15.9)
Immature Grans (Abs): 0 10*3/uL (ref 0.0–0.1)
Immature Granulocytes: 0 %
Lymphocytes Absolute: 2.2 10*3/uL (ref 0.7–3.1)
Lymphs: 24 %
MCH: 24.6 pg — ABNORMAL LOW (ref 26.6–33.0)
MCHC: 31.4 g/dL — ABNORMAL LOW (ref 31.5–35.7)
MCV: 78 fL — ABNORMAL LOW (ref 79–97)
Monocytes Absolute: 0.8 10*3/uL (ref 0.1–0.9)
Monocytes: 8 %
Neutrophils Absolute: 6 10*3/uL (ref 1.4–7.0)
Neutrophils: 66 %
Platelets: 385 10*3/uL (ref 150–450)
RBC: 5.25 x10E6/uL (ref 3.77–5.28)
RDW: 14.1 % (ref 11.7–15.4)
WBC: 9.1 10*3/uL (ref 3.4–10.8)

## 2021-04-29 LAB — VITAMIN B12: Vitamin B-12: 512 pg/mL (ref 232–1245)

## 2021-05-01 ENCOUNTER — Encounter: Payer: Self-pay | Admitting: Family Medicine

## 2021-05-15 ENCOUNTER — Other Ambulatory Visit: Payer: Self-pay

## 2021-05-15 ENCOUNTER — Encounter: Payer: Self-pay | Admitting: Obstetrics and Gynecology

## 2021-05-15 ENCOUNTER — Ambulatory Visit: Payer: BC Managed Care – PPO | Admitting: Obstetrics and Gynecology

## 2021-05-15 VITALS — BP 128/74 | Ht 62.0 in | Wt 176.4 lb

## 2021-05-15 DIAGNOSIS — Z803 Family history of malignant neoplasm of breast: Secondary | ICD-10-CM | POA: Diagnosis not present

## 2021-05-15 NOTE — Progress Notes (Signed)
Obstetrics & Gynecology Office Visit   Chief Complaint  Patient presents with   breast exam    Family history with personal risk > 20% lifetime    History of Present Illness: 45 y.o. G16P2013 female with a strong family history of  breast cancer. She had a Myriad test for breast cancer-related gene mutations last year, which was negative.  She has a [place] lifetime risk of breast cancer of 24.5%.   She had her last mammogram in 12/2020 and it was BiRads 1.  She had a lump biopsied last year that was benign.  She has no complaints today.     Past Medical History:  Diagnosis Date   Abnormal TSH 06/07/2017   3.8 on NOB labs and started on Synthroid. Recheck late August.   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   Endometrial polyp 12/08/2019   Family history of breast cancer    Family history of pancreatic cancer    History of pre-eclampsia 07/01/2017   Increased risk of breast cancer 04/2020   IBIS=24.9%/riskscore=16.5%   PONV (postoperative nausea and vomiting)    Positive ANA (antinuclear antibody) 02/22/2014   Sees Rheumatology, no meds   Symptomatic cholelithiasis     Past Surgical History:  Procedure Laterality Date   BREAST LUMPECTOMY Left 2010   benign indication   CESAREAN SECTION     2019, 2015   CHOLECYSTECTOMY  10/20/2020   gallstones   [place] N/A 12/08/2019   Procedure: DILATATION & CURETTAGE/HYSTEROSCOPY WITH MYOSURE POLYPECOMTY;  Surgeon: Will Bonnet, MD;  Location: [facility] ORS;  Service: Gynecology;  Laterality: N/A;    Gynecologic History: Patient's last menstrual period was 05/09/2021.  Obstetric History: [id number]  Family History  Problem Relation Age of Onset   Pancreatic cancer Mother 40   Breast cancer Maternal Aunt        twice-50s and 69s   Breast cancer Maternal Grandmother        twice- 93s and 58s   Breast cancer Maternal Aunt 77    Diabetes Paternal Grandmother    Stroke Paternal Grandmother    Colon cancer Neg Hx     Social History   Socioeconomic History   Marital status: Married    Spouse name: james   Number of children: 3   Years of education: Not on file   Highest education level: Not on file  Occupational History   Occupation: professor  Tobacco Use   Smoking status: Never   Smokeless tobacco: Never  Vaping Use   Vaping Use: Never used  Substance and Sexual Activity   Alcohol use: Not Currently   Drug use: Never   Sexual activity: Yes    Partners: Male    Birth control/protection: None    Comment: Vasectomy  Other Topics Concern   Not on file  Social History Narrative   Patient lives with husband and 3 children.  Feels safe in her home   Social Determinants of Health   Financial Resource Strain: Not on file  Food Insecurity: Not on file  Transportation Needs: Not on file  Physical Activity: Not on file  Stress: Not on file  Social Connections: Not on file  Intimate Partner Violence: Not on file    No Known Allergies  Prior to Admission medications   Medication Sig Start Date End Date Taking? Authorizing Provider  Multiple Vitamin (MULTIVITAMIN WITH MINERALS) TABS tablet Take 1  tablet by mouth 4 (four) times a week.    [provider]  Probiotic Product (PROBIOTIC PO) Take 1 capsule by mouth daily.    [provider]    Review of Systems  Constitutional: Negative.   HENT: Negative.    Eyes: Negative.   Respiratory: Negative.    Cardiovascular: Negative.   Gastrointestinal: Negative.   Genitourinary: Negative.   Musculoskeletal: Negative.   Skin: Negative.   Neurological: Negative.   Psychiatric/Behavioral: Negative.      Physical Exam BP 128/74   Ht $R'5\' 2"'NV$  (1.575 m)   Wt 176 lb 6.4 oz (80 kg)   LMP 05/09/2021   BMI 32.26 kg/m  Patient's last menstrual period was 05/09/2021. Physical Exam Constitutional:      General: She is not in acute distress.     Appearance: Normal appearance.  Genitourinary:  Breasts:    Tanner Score is 5.     Right: No swelling, bleeding, inverted nipple, mass, nipple discharge, skin change, tenderness, breast implant, axillary adenopathy or supraclavicular adenopathy.     Left: No swelling, bleeding, inverted nipple, mass, nipple discharge, skin change, tenderness, axillary adenopathy or supraclavicular adenopathy.  HENT:     Head: Normocephalic and atraumatic.  Eyes:     General: No scleral icterus.    Conjunctiva/sclera: Conjunctivae normal.  Lymphadenopathy:     Upper Body:     Right upper body: No supraclavicular or axillary adenopathy.     Left upper body: No supraclavicular or axillary adenopathy.  Neurological:     General: No focal deficit present.     Mental Status: She is alert and oriented to person, place, and time.     Cranial Nerves: No cranial nerve deficit.  Psychiatric:        Mood and Affect: Mood normal.        Behavior: Behavior normal.        Judgment: Judgment normal.    Female chaperone present for pelvic and breast  portions of the physical exam  Assessment: 45 y.o. G44P2013 female here for  1. Family history of breast cancer      Plan: Problem List Items Addressed This Visit   None Visit Diagnoses     Family history of breast cancer    -  Primary   Relevant Orders   MR BREAST BILATERAL W CONTRAST      Patient reassured. Next exam with annual.  MRI due in August.  Continue with current surveillance given her high risk of breast cancer.    Prentice Docker, MD 05/15/2021 8:46 AM

## 2021-05-25 ENCOUNTER — Other Ambulatory Visit: Payer: Self-pay | Admitting: Obstetrics and Gynecology

## 2021-05-25 DIAGNOSIS — Z803 Family history of malignant neoplasm of breast: Secondary | ICD-10-CM

## 2021-05-30 DIAGNOSIS — Z86018 Personal history of other benign neoplasm: Secondary | ICD-10-CM | POA: Diagnosis not present

## 2021-05-30 DIAGNOSIS — Z872 Personal history of diseases of the skin and subcutaneous tissue: Secondary | ICD-10-CM | POA: Diagnosis not present

## 2021-05-30 DIAGNOSIS — D485 Neoplasm of uncertain behavior of skin: Secondary | ICD-10-CM | POA: Diagnosis not present

## 2021-06-04 DIAGNOSIS — F411 Generalized anxiety disorder: Secondary | ICD-10-CM | POA: Diagnosis not present

## 2021-06-13 DIAGNOSIS — D235 Other benign neoplasm of skin of trunk: Secondary | ICD-10-CM | POA: Diagnosis not present

## 2021-06-29 ENCOUNTER — Ambulatory Visit: Payer: Self-pay | Admitting: *Deleted

## 2021-06-29 ENCOUNTER — Encounter (HOSPITAL_COMMUNITY): Payer: Self-pay

## 2021-06-29 ENCOUNTER — Ambulatory Visit (HOSPITAL_COMMUNITY)
Admission: EM | Admit: 2021-06-29 | Discharge: 2021-06-29 | Disposition: A | Discharge status: Home / Self Care | Payer: BC Managed Care – PPO | Attending: Emergency Medicine | Admitting: Emergency Medicine

## 2021-06-29 ENCOUNTER — Other Ambulatory Visit: Payer: Self-pay

## 2021-06-29 DIAGNOSIS — A0472 Enterocolitis due to Clostridium difficile, not specified as recurrent: Secondary | ICD-10-CM | POA: Diagnosis not present

## 2021-06-29 MED ORDER — VANCOMYCIN HCL 125 MG PO CAPS: 125.0000 mg | ORAL_CAPSULE | Freq: Four times a day (QID) | ORAL | 0 refills | Status: DC

## 2021-06-29 NOTE — Telephone Encounter (Signed)
Patient was advised, there were no avalibility in office tomorrow and I offered to make appt at [facility] for her to be evaluated.Patient states that she and her family have plans int he morning to leave on vacation and would need to be seen today. I advised patient to contact [facility] since it is closer to her, I gave patient phone number and hours of operation. Patient wanted to let you know that she will be going this evening. KW

## 2021-06-29 NOTE — ED Triage Notes (Signed)
Pt presents with diarrhea x 2 days.  States she was taking antibiotics and stopped taking them on Sunday after a procedure she had. States she is concerned that the antibiotics might have caused the diarrhea. States she has pain during a bowel movement. States her PCP was concerned of C diff.

## 2021-06-29 NOTE — Telephone Encounter (Signed)
Noted  

## 2021-06-29 NOTE — Telephone Encounter (Signed)
Needs to be evaluated if anyone has appts or consider [place] or [place]. Concern for possible C Diff with recent Cipro use.

## 2021-06-29 NOTE — Discharge Instructions (Addendum)
Take the vancomycin 1 pill 4 times a day for the next 10 day.    Make sure you are drinking plenty of fluids, especially water.    Return or go to the Emergency Department if symptoms worsen or do not improve in the next few days.

## 2021-06-29 NOTE — ED Provider Notes (Signed)
[facility]    CSN: [id number] Arrival date & time: 06/29/21  1800      History   Chief Complaint Chief Complaint  Patient presents with   Diarrhea    HPI Kayla West is a 45 y.o. female.   Patient here for evaluation of diarrhea that started about 2 days ago.  Reports diarrhea is very watery and frequent.  Denies any blood in diarrhea or significant abdominal pain.  Reports recently took clindamycin for a skin infection and finished antibiotics on _0 19, 2015   CHOLECYSTECTOMY  10/20/2020   gallstones   [place] N/A  12/08/2019   Procedure: DILATATION & CURETTAGE/HYSTEROSCOPY WITH MYOSURE POLYPECOMTY;  Surgeon: Will Bonnet, MD;  Location: [facility] ORS;  Service: Gynecology;  Laterality: N/A;    OB History     Gravida  3   Para  2   Term  2   Preterm      AB  1   Living  3      SAB  1   IAB      Ectopic      Multiple  1   Live Births  3            Home Medications    Prior to Admission medications   Medication Sig Start Date End Date Taking? Authorizing Provider  vancomycin (VANCOCIN) 125 MG capsule Take 1 capsule (125 mg total) by mouth 4 (four) times daily for 10 days. 06/29/21 07/09/21 Yes Pearson Forster, NP  Multiple Vitamin (MULTIVITAMIN WITH MINERALS) TABS tablet Take 1 tablet by mouth 4 (four) times a week.    [provider]  Probiotic Product (PROBIOTIC PO) Take 1 capsule by mouth daily.    [provider]    Family History Family History  Problem Relation Age of Onset   Pancreatic cancer Mother 74   Breast cancer Maternal Aunt        twice-50s and 40s   Breast cancer Maternal Grandmother        twice- 80s and 30s  Breast cancer Maternal Aunt 77   Diabetes Paternal Grandmother    Stroke Paternal Grandmother    Colon cancer Neg Hx     Social History Social History   Tobacco Use   Smoking status: Never   Smokeless tobacco: Never  Vaping Use   Vaping Use: Never used  Substance Use Topics   Alcohol use: Not Currently   Drug use: Never     Allergies   Patient has no known allergies.   Review of Systems Review of Systems  Gastrointestinal:  Positive for diarrhea.  All other systems reviewed and are negative.   Physical Exam Triage Vital Signs ED Triage Vitals  Enc Vitals Group     BP 06/29/21 1830 138/87     Pulse Rate 06/29/21 1830 79     Resp 06/29/21 1830 17     Temp 06/29/21 1830 98.2 F (36.8 C)     Temp Source 06/29/21 1830 Oral     SpO2 06/29/21 1830 98 %     Weight --      Height --      Head  Circumference --      Peak Flow --      Pain Score 06/29/21 1828 0     Pain Loc --      Pain Edu? --      Excl. in [place]? --    No data found.  Updated Vital Signs BP 138/87 (BP Location: Left Arm)   Pulse 79   Temp 98.2 F (36.8 C) (Oral)   Resp 17   LMP 06/27/2021 (Exact Date)   SpO2 98%   Visual Acuity Right Eye Distance:   Left Eye Distance:   Bilateral Distance:    Right Eye Near:   Left Eye Near:    Bilateral Near:     Physical Exam Vitals and nursing note reviewed.  Constitutional:      General: She is not in acute distress.    Appearance: Normal appearance. She is not ill-appearing, toxic-appearing or diaphoretic.  HENT:     Head: Normocephalic and atraumatic.  Eyes:     Conjunctiva/sclera: Conjunctivae normal.  Cardiovascular:     Rate and Rhythm: Normal rate.     Pulses: Normal pulses.     Heart sounds: Normal heart sounds.  Pulmonary:     Effort: Pulmonary effort is normal.     Breath sounds: Normal breath sounds.  Abdominal:     General: Abdomen is flat.     Palpations: Abdomen is soft.  Musculoskeletal:        General: Normal range of motion.     Cervical back: Normal range of motion.  Skin:    General: Skin is warm and dry.  Neurological:     General: No focal deficit present.     Mental Status: She is alert and oriented to person, place, and time.  Psychiatric:        Mood and Affect: Mood normal.     UC Treatments / Results  Labs (all labs ordered are listed, but only abnormal results are displayed) Labs Reviewed - No data to display  EKG   Radiology No results found.  Procedures Procedures (including critical care time)  Medications Ordered in UC Medications - No data to display  Initial Impression / Assessment and Plan / UC Course  I have reviewed the triage vital signs and the nursing notes.  Pertinent labs & imaging results that were available during my care of the patient were reviewed  by me and considered in my medical  decision making (see chart for details).    Assessment negative for red flags or concerns.  As patient recently took clindamycin, this is likely c. Diff diarrhea.  Will go ahead and treat with vancomycin QID for the next 10 days.  Encouraged fluids, especially water.  Recommend probiotics.  Strict ED follow up for any worsening symptoms.  Follow up with your primary care provider for re-evaluation.   Final Clinical Impressions(s) / UC Diagnoses   Final diagnoses:  C. difficile diarrhea     Discharge Instructions      Take the vancomycin 1 pill 4 times a day for the next 10 day.    Make sure you are drinking plenty of fluids, especially water.    Return or go to the Emergency Department if symptoms worsen or do not improve in the next few days.      ED Prescriptions     Medication Sig Dispense Auth. Provider   vancomycin (VANCOCIN) 125 MG capsule Take 1 capsule (125 mg total) by mouth 4 (four) times daily for 10 days. 40 capsule Pearson Forster, NP      PDMP not reviewed this encounter.   Pearson Forster, NP 06/29/21 1926

## 2021-06-29 NOTE — Telephone Encounter (Signed)
Per agent: "Patient called having diarrhea, wants to know what she can do. Please call back"   Pt reports completed course of clindamycin Sunday. Had skin lesion excised, became infected. States loose stools yesterday, "Watery today" States 7 times "At least" last 24 hours. "Forceful, water." States brownish yellow, "Horrible, different odor" Clindamycin was a 7 day course of 300 mg, TID. Denies nausea, no vomiting or fever. Does reports mild abdominal cramping "When using bathroom." States is staying hydrated, no S/S of dehydration. No availability in protocol timeframe. Assured pt NT would route to practice for PCPs review and final disposition. Advised ED for worsening symptoms. Pt verbalizes understanding.  CB# [id number]  Reason for Disposition  [1] Recent antibiotic therapy (i.e., within last 2 months) AND [2] diarrhea present > 3 days since antibiotic was stopped  Answer Assessment - Initial Assessment Questions 1. DIARRHEA SEVERITY: "How bad is the diarrhea?" "How many more stools have you had in the past 24 hours than normal?"    - NO DIARRHEA (SCALE 0)   - MILD (SCALE 1-3): Few loose or mushy BMs; increase of 1-3 stools over normal daily number of stools; mild increase in ostomy output.   -  MODERATE (SCALE 4-7): Increase of 4-6 stools daily over normal; moderate increase in ostomy output. * SEVERE (SCALE 8-10; OR 'WORST POSSIBLE'): Increase of 7 or more stools daily over normal; moderate increase in ostomy output; incontinence.     7 or more 2. ONSET: "When did the diarrhea begin?"      yesterday 3. BM CONSISTENCY: "How loose or watery is the diarrhea?"      Watery today, loose yesterday 4. VOMITING: "Are you also vomiting?" If Yes, ask: "How many times in the past 24 hours?"      no 5. ABDOMINAL PAIN: "Are you having any abdominal pain?" If Yes, ask: "What does it feel like?" (e.g., crampy, dull, intermittent, constant)      ONly went going "Cramping" 6. ABDOMINAL PAIN  SEVERITY: If present, ask: "How bad is the pain?"  (e.g., Scale 1-10; mild, moderate, or severe)   - MILD (1-3): doesn't interfere with normal activities, abdomen soft and not tender to touch    - MODERATE (4-7): interferes with normal activities or awakens from sleep, abdomen tender to touch    - SEVERE (8-10): excruciating pain, doubled over, unable to do any normal activities       no 7. ORAL INTAKE: If vomiting, "Have you been able to drink liquids?" "How much liquids have you had in the past 24 hours?"     no 8. HYDRATION: "Any signs of dehydration?" (e.g., dry mouth [not just dry lips], too weak to stand, dizziness, new weight loss) "When did you last urinate?"     yes  10. ANTIBIOTIC USE: "Are you taking antibiotics now or have you taken antibiotics in the past 2 months?"       Clindamycin completed Sunday, TID 300 mg, 7 day course 11. OTHER SYMPTOMS: "Do you have any other symptoms?" (e.g., fever, blood in stool)       No fever  Protocols used: Diarrhea-A-AH

## 2021-07-03 ENCOUNTER — Other Ambulatory Visit: Payer: BC Managed Care – PPO

## 2021-07-10 ENCOUNTER — Ambulatory Visit
Admission: RE | Admit: 2021-07-10 | Discharge: 2021-07-10 | Disposition: A | Discharge status: Home / Self Care | Payer: BC Managed Care – PPO | Source: Ambulatory Visit | Attending: Obstetrics and Gynecology | Admitting: Obstetrics and Gynecology

## 2021-07-10 ENCOUNTER — Other Ambulatory Visit: Payer: Self-pay

## 2021-07-10 DIAGNOSIS — N6489 Other specified disorders of breast: Secondary | ICD-10-CM | POA: Diagnosis not present

## 2021-07-10 DIAGNOSIS — Z803 Family history of malignant neoplasm of breast: Secondary | ICD-10-CM

## 2021-07-10 IMAGING — MR MR BREAST BILAT WO/W CM
8 of 12 series · 33 of 48 positions shown · IV contrast (8 ml gadavist)
Comparison: Previous exams including breast MRI dated [DATE].

CLINICAL DATA: Strong family history of breast cancer. Lifetime
risk for breast cancer greater than 20%. History of benign RIGHT
and chronic inflammation.

LABS:  Not performed on-site.
EXAM:
BILATERAL BREAST MRI WITH AND WITHOUT CONTRAST
TECHNIQUE: Multiplanar, multisequence MR images of both breasts were obtained
prior to and following the intravenous administration of 8 ml of
Gadavist

[Series 2: t2_tirm_tra ipat (a-p) · axial · 3.0mm · 0.66mm/px · 1 of 48 slices shown]
[im 1/48]
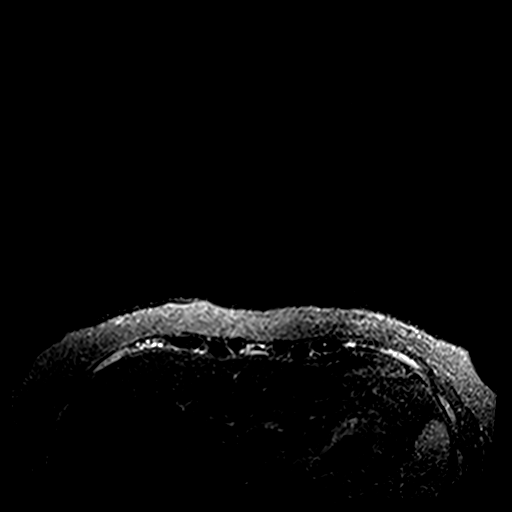

[Series 3: fl3d pre-cm non · axial · non-contrast · 1.2mm · 0.89mm/px · z∈[-55,+116]mm · 5 of 144 slices shown]
[im 1/144]
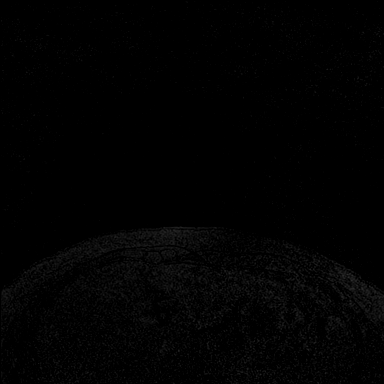
[im 36/144]
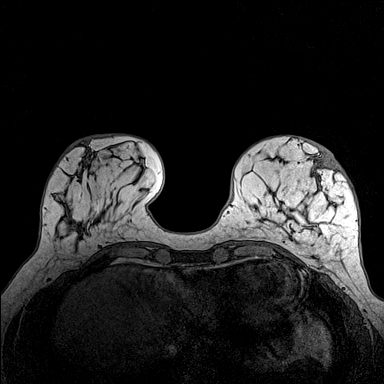
[im 72/144]
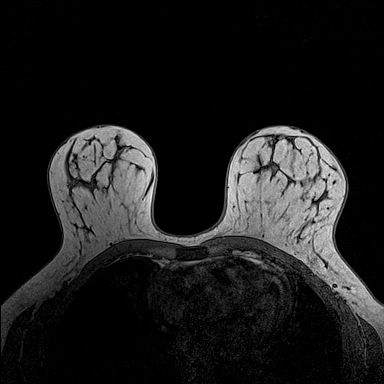
[im 108/144]
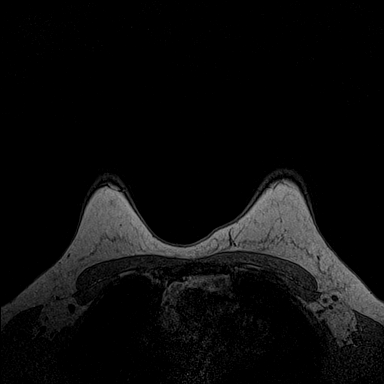
[im 144/144]
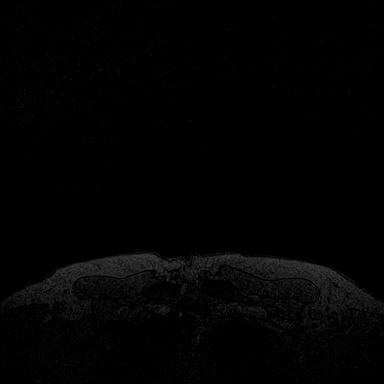

[Series 4: fl3d pre-cm · axial · non-contrast · 1.2mm · 0.89mm/px · z∈[-55,+116]mm · 5 of 144 slices shown]
[im 1/144]
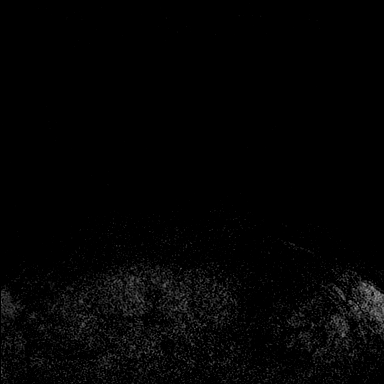
[im 36/144]
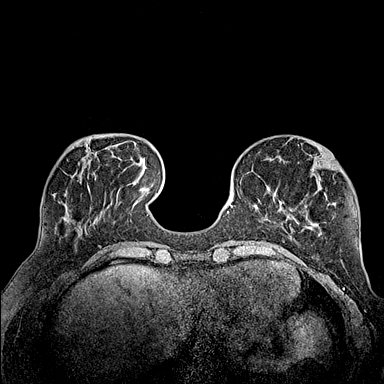
[im 72/144]
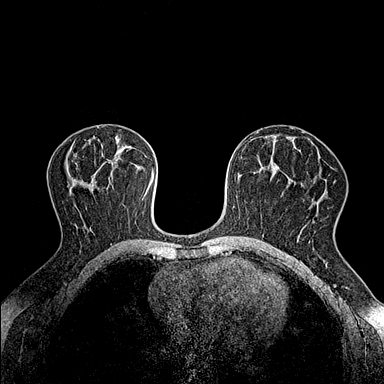
[im 108/144]
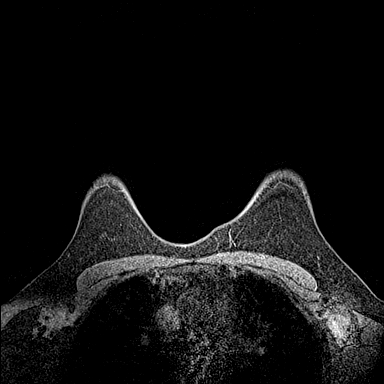
[im 144/144]
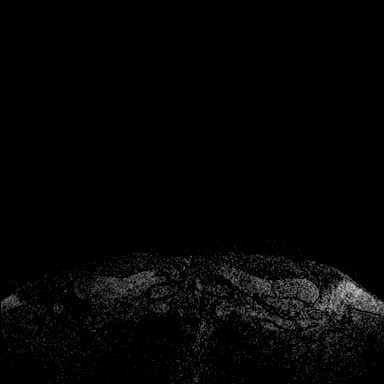

[Series 5: fl3d post-cm 20 · axial · 1.2mm · 0.89mm/px · z∈[-55,+116]mm · 5 of 144 slices shown (1 of 3)]
[im 1/144]
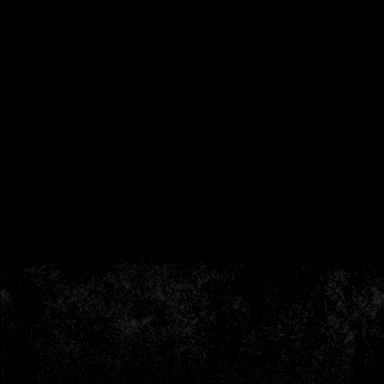
[im 36/144]
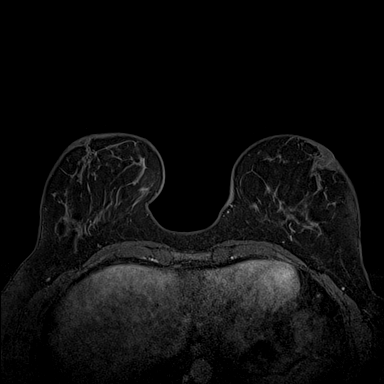
[im 72/144]
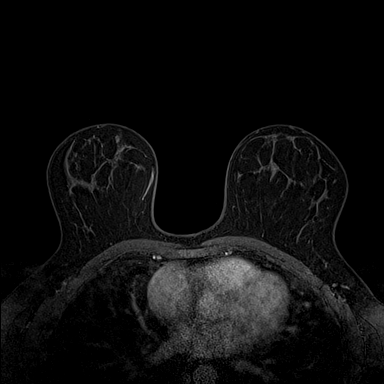
[im 108/144]
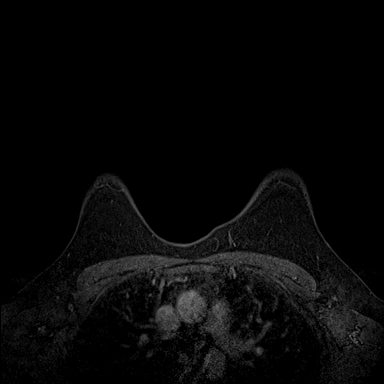
[im 144/144]
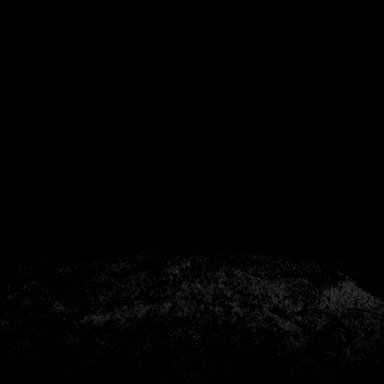

[Series 6: fl3d post-cm 20 · axial · 1.2mm · 0.89mm/px · z∈[-55,+116]mm · 5 of 144 slices shown (2 of 3)]
[im 1/144]
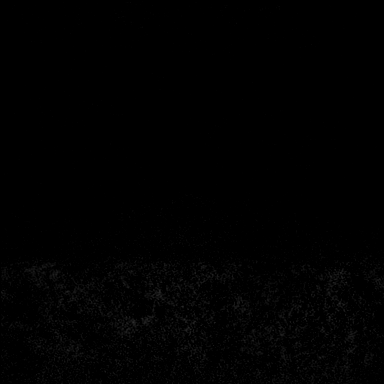
[im 36/144]
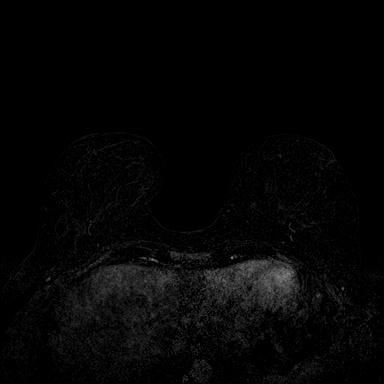
[im 72/144]
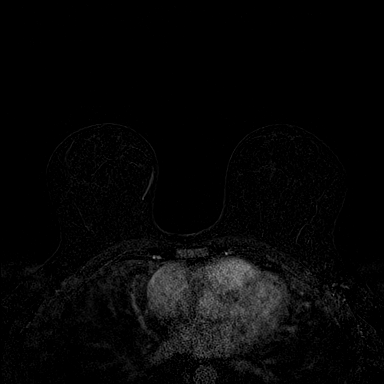
[im 108/144]
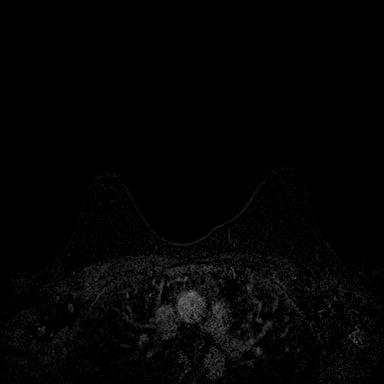
[im 144/144]
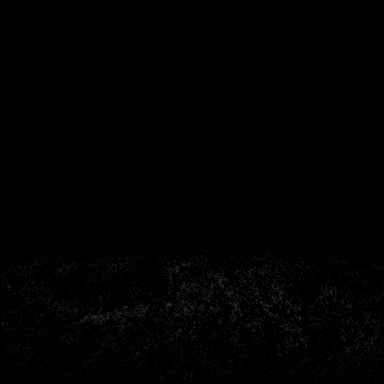

[Series 7: fl3d post-cm 20 · axial · 172.8mm · 0.89mm/px · 1 of 1 slices shown (3 of 3)]
[im 1/1]
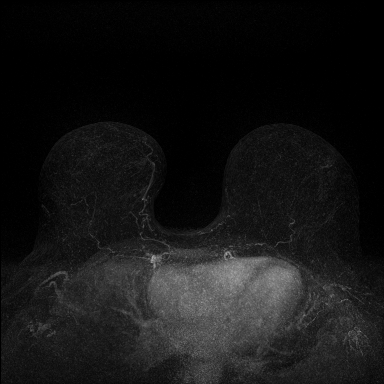

[Series 8: fl3d post-cm 3min · axial · 1.2mm · 0.89mm/px · z∈[-55,+116]mm · 6 of 144 slices shown]
[im 1/144]
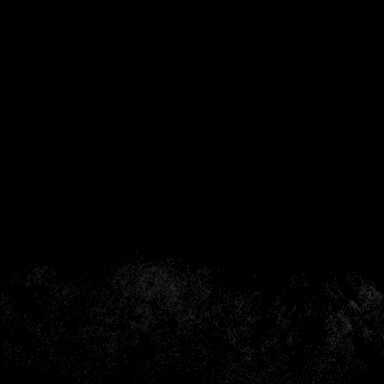
[im 29/144]
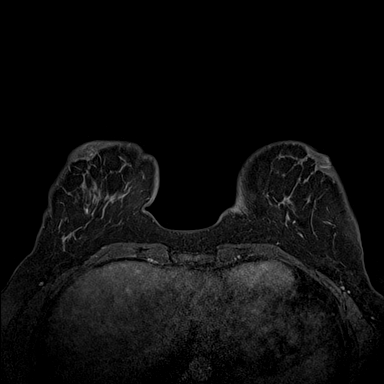
[im 58/144]
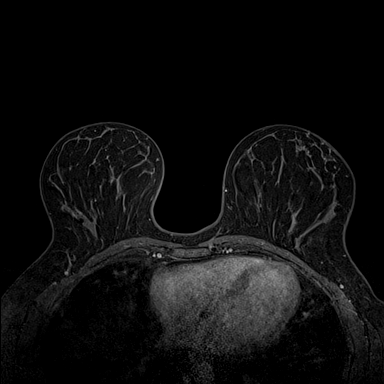
[im 86/144]
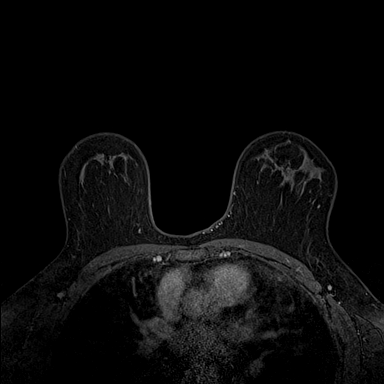
[im 115/144]
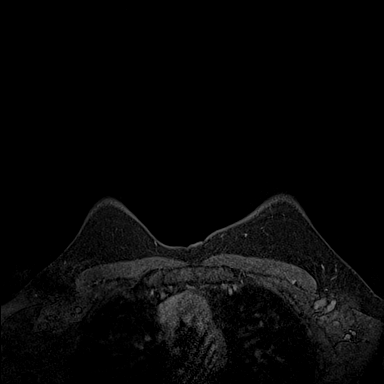
[im 144/144]
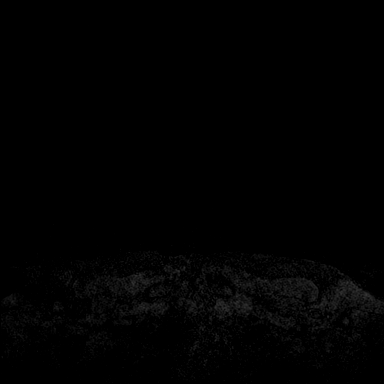

[Series 9: fl3d post-cm 3min_sub · axial · 1.2mm · 0.89mm/px · z∈[-55,+81]mm · 5 of 144 slices shown]
[im 1/144]
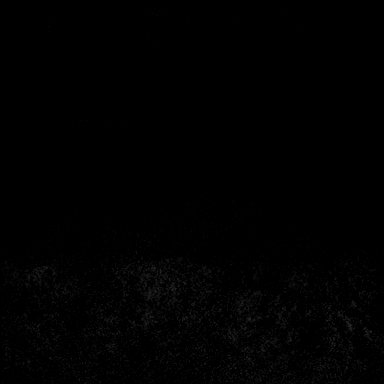
[im 29/144]
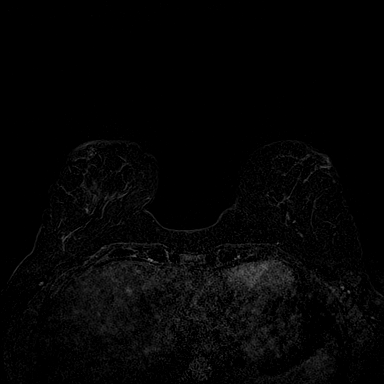
[im 58/144]
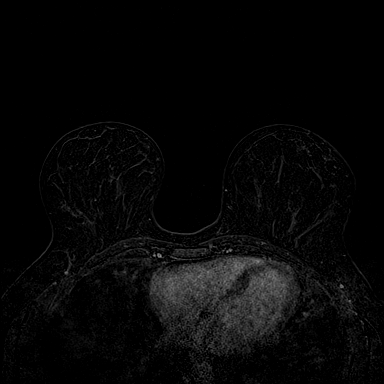
[im 86/144]
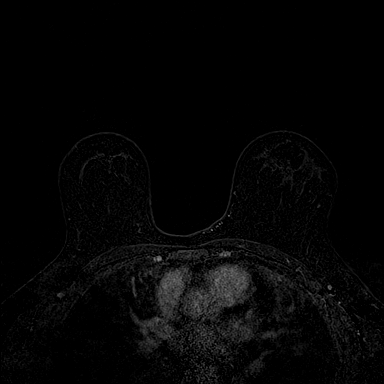
[im 115/144]
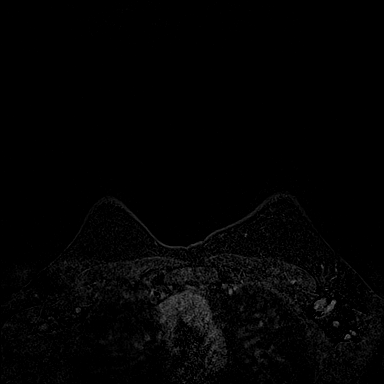

[33 of 48 positions shown; findings below may reference images not displayed]

Three-dimensional MR images were rendered by post-processing of the
original MR data on an independent workstation. The
three-dimensional MR images were interpreted, and findings are
reported in the following complete MRI report for this study. Three
dimensional images were evaluated at the independent interpreting
workstation using the DynaCAD thin client.
FINDINGS: Breast composition: b. Scattered fibroglandular tissue.

Background parenchymal enhancement: Moderate.

Right breast: No suspicious enhancing mass or non-mass enhancement
within the RIGHT breast. Biopsy site markers (artifact) are seen
within the outer RIGHT breast, corresponding to the earlier benign
biopsy sites.

Left breast: No suspicious enhancing mass or non-mass enhancement
within the LEFT breast.

Lymph nodes: No abnormal appearing lymph nodes.

Ancillary findings:  None.
IMPRESSION: No MRI evidence of malignancy within either breast.

RECOMMENDATION:
1. Annual screening mammograms. Next bilateral screening mammogram
will be due in [DATE].
2. Annual screening breast MRIs. Per American Cancer Society
guidelines, annual screening MRI of the breasts is recommended if a
risk assessment calculation for breast cancer, preferably using the
Tyrer-Cuzick or Gail model, measures greater than 20%.

BI-RADS CATEGORY  2: Benign.

## 2021-07-10 MED ORDER — GADOBUTROL 1 MMOL/ML IV SOLN
8.0000 mL | Freq: Once | INTRAVENOUS | Status: AC | PRN
  Administered 2021-07-10: 8 mL via INTRAVENOUS

## 2021-07-18 ENCOUNTER — Other Ambulatory Visit: Payer: Self-pay | Admitting: Obstetrics and Gynecology

## 2021-07-28 ENCOUNTER — Telehealth: Payer: Self-pay

## 2021-07-28 DIAGNOSIS — R197 Diarrhea, unspecified: Secondary | ICD-10-CM

## 2021-07-28 MED ORDER — VANCOMYCIN HCL 125 MG PO CAPS: 125.0000 mg | ORAL_CAPSULE | Freq: Four times a day (QID) | ORAL | 0 refills | Status: AC

## 2021-07-28 NOTE — Telephone Encounter (Signed)
Pt states she got completely better with Vancomycin but the symptoms are coming back.  She would like to try another round of it.  RX sent to [facility].   Thanks,   -Vernona Rieger

## 2021-07-28 NOTE — Telephone Encounter (Signed)
If she got better with vancomycin and sx are just now coming back, then can send in another round of it to her pharmacy. If she never got better in the first place then she will need to be seen and have stool studies done.

## 2021-07-28 NOTE — Telephone Encounter (Signed)
Copied from CRM [id number]. Topic: General - Other >> Jul 28, 2021  2:02 PM Jaquita Rector A wrote: Reason for CRM: Patient called in to inform dr B that she is starting to present with symptoms again similar to those she had before being treated for C-diff. Asking what does she need to do please call at Ph# [phone]

## 2021-07-28 NOTE — Telephone Encounter (Signed)
Please review for Dr. Leonard Schwartz.  Does Kayla West need to go back to urgent care?  Thanks,   -Vernona Rieger

## 2021-07-31 ENCOUNTER — Telehealth (INDEPENDENT_AMBULATORY_CARE_PROVIDER_SITE_OTHER): Payer: BC Managed Care – PPO | Admitting: Family Medicine

## 2021-07-31 DIAGNOSIS — F4320 Adjustment disorder, unspecified: Secondary | ICD-10-CM

## 2021-07-31 MED ORDER — HYDROXYZINE HCL 10 MG PO TABS: 10.0000 mg | ORAL_TABLET | Freq: Three times a day (TID) | ORAL | 3 refills | Status: DC | PRN

## 2021-07-31 NOTE — Progress Notes (Signed)
MyChart Video Visit    Virtual Visit via Video Note   This visit type was conducted due to national recommendations for restrictions regarding the COVID-19 Pandemic (e.g. social distancing) in an effort to limit this patient's exposure and mitigate transmission in our community. This patient is at least at moderate risk for complications without adequate follow up. This format is felt to be most appropriate for this patient at this time. Physical exam was limited by quality of the video and audio technology used for the visit.   Patient location: home Provider location: [facility]  I discussed the limitations of evaluation and management by telemedicine and the availability of in person appointments. The patient expressed understanding and agreed to proceed.  Patient: Kayla West   DOB: 04-01-1976   45 y.o. Female  MRN: [id number] Visit Date: 07/31/2021  Today's healthcare provider: Mila Merry, MD   Chief Complaint  Patient presents with   Anxiety   Subjective    Anxiety Presents for initial visit. The problem has been gradually worsening. Symptoms include excessive worry, insomnia, nervous/anxious behavior and panic. Patient reports no decreased concentration, depressed mood, dizziness, nausea, palpitations or suicidal ideas. The quality of sleep is poor. Nighttime awakenings: several.   Is situational, often triggered by health concerns and occasionally related to kids struggling in school. Does not affect her most days. Has trouble functioning when anxiety is triggered. Associated with panicky and overwhelmed feeling. Usually lasts a few days and resolves when triggering situation resolves.   Has been working with counselor in [place] to help with coping strategies, which have been helpful, but anxiety is still sometimes overwhelming.    Medications: Outpatient Medications Prior to Visit  Medication Sig   Multiple Vitamin (MULTIVITAMIN WITH MINERALS) TABS tablet Take 1  tablet by mouth 4 (four) times a week.   Probiotic Product (PROBIOTIC PO) Take 1 capsule by mouth daily.   vancomycin (VANCOCIN) 125 MG capsule Take 1 capsule (125 mg total) by mouth 4 (four) times daily for 14 days.   No facility-administered medications prior to visit.    Review of Systems  Constitutional: Negative.   Respiratory: Negative.    Cardiovascular: Negative.  Negative for palpitations.  Gastrointestinal:  Positive for diarrhea. Negative for abdominal distention, abdominal pain, anal bleeding, blood in stool, constipation, nausea, rectal pain and vomiting.  Neurological:  Negative for dizziness, light-headedness and headaches.  Psychiatric/Behavioral:  Negative for decreased concentration, dysphoric mood, self-injury, sleep disturbance and suicidal ideas. The patient is nervous/anxious and has insomnia.      Objective    There were no vitals taken for this visit.   Physical Exam   Awake, alert, oriented x 3. In no apparent distress    Assessment & Plan     1. Adult situational stress disorder  - hydrOXYzine (ATARAX/VISTARIL) 10 MG tablet; Take 1-2 tablets (10-20 mg total) by mouth 3 (three) times daily as needed.  Dispense: 20 tablet; Refill: 3  No follow-ups on file.     I discussed the assessment and treatment plan with the patient. The patient was provided an opportunity to ask questions and all were answered. The patient agreed with the plan and demonstrated an understanding of the instructions.   The patient was advised to call back or seek an in-person evaluation if the symptoms worsen or if the condition fails to improve as anticipated.  I provided 12 minutes of non-face-to-face time during this encounter.  The entirety of the information documented in the History of  Present Illness, Review of Systems and Physical Exam were personally obtained by me. Portions of this information were initially documented by the CMA and reviewed by me for thoroughness and  accuracy.    Mila Merry, MD [facility] [phone] (phone) [phone] (fax)  [facility]

## 2021-08-31 DIAGNOSIS — M546 Pain in thoracic spine: Secondary | ICD-10-CM | POA: Diagnosis not present

## 2021-08-31 DIAGNOSIS — M5416 Radiculopathy, lumbar region: Secondary | ICD-10-CM | POA: Diagnosis not present

## 2021-08-31 DIAGNOSIS — M9903 Segmental and somatic dysfunction of lumbar region: Secondary | ICD-10-CM | POA: Diagnosis not present

## 2021-08-31 DIAGNOSIS — M9902 Segmental and somatic dysfunction of thoracic region: Secondary | ICD-10-CM | POA: Diagnosis not present

## 2021-10-02 DIAGNOSIS — F411 Generalized anxiety disorder: Secondary | ICD-10-CM | POA: Diagnosis not present

## 2021-11-30 DIAGNOSIS — Z872 Personal history of diseases of the skin and subcutaneous tissue: Secondary | ICD-10-CM | POA: Diagnosis not present

## 2021-11-30 DIAGNOSIS — Z86018 Personal history of other benign neoplasm: Secondary | ICD-10-CM | POA: Diagnosis not present

## 2021-11-30 DIAGNOSIS — L578 Other skin changes due to chronic exposure to nonionizing radiation: Secondary | ICD-10-CM | POA: Diagnosis not present

## 2021-12-05 DIAGNOSIS — Z1239 Encounter for other screening for malignant neoplasm of breast: Secondary | ICD-10-CM | POA: Diagnosis not present

## 2021-12-05 DIAGNOSIS — Z803 Family history of malignant neoplasm of breast: Secondary | ICD-10-CM | POA: Diagnosis not present

## 2021-12-05 DIAGNOSIS — Z01419 Encounter for gynecological examination (general) (routine) without abnormal findings: Secondary | ICD-10-CM | POA: Diagnosis not present

## 2021-12-05 DIAGNOSIS — Z1231 Encounter for screening mammogram for malignant neoplasm of breast: Secondary | ICD-10-CM | POA: Diagnosis not present

## 2021-12-07 DIAGNOSIS — F411 Generalized anxiety disorder: Secondary | ICD-10-CM | POA: Diagnosis not present

## 2021-12-15 DIAGNOSIS — M9902 Segmental and somatic dysfunction of thoracic region: Secondary | ICD-10-CM | POA: Diagnosis not present

## 2021-12-15 DIAGNOSIS — M5416 Radiculopathy, lumbar region: Secondary | ICD-10-CM | POA: Diagnosis not present

## 2021-12-15 DIAGNOSIS — M546 Pain in thoracic spine: Secondary | ICD-10-CM | POA: Diagnosis not present

## 2021-12-15 DIAGNOSIS — M9903 Segmental and somatic dysfunction of lumbar region: Secondary | ICD-10-CM | POA: Diagnosis not present

## 2021-12-29 DIAGNOSIS — M546 Pain in thoracic spine: Secondary | ICD-10-CM | POA: Diagnosis not present

## 2021-12-29 DIAGNOSIS — M9902 Segmental and somatic dysfunction of thoracic region: Secondary | ICD-10-CM | POA: Diagnosis not present

## 2021-12-29 DIAGNOSIS — M5416 Radiculopathy, lumbar region: Secondary | ICD-10-CM | POA: Diagnosis not present

## 2021-12-29 DIAGNOSIS — M9903 Segmental and somatic dysfunction of lumbar region: Secondary | ICD-10-CM | POA: Diagnosis not present

## 2022-01-03 ENCOUNTER — Ambulatory Visit
Admission: RE | Admit: 2022-01-03 | Discharge: 2022-01-03 | Disposition: A | Discharge status: Home / Self Care | Payer: BC Managed Care – PPO | Source: Ambulatory Visit | Attending: Obstetrics and Gynecology | Admitting: Obstetrics and Gynecology

## 2022-01-03 DIAGNOSIS — Z1231 Encounter for screening mammogram for malignant neoplasm of breast: Secondary | ICD-10-CM | POA: Diagnosis not present

## 2022-01-03 IMAGING — MG MM DIGITAL SCREENING BILAT W/ TOMO AND CAD
8 series · 8 of 24 positions shown · non-contrast
Comparison: Previous exam(s).

CLINICAL DATA: Screening.

EXAM:
DIGITAL SCREENING BILATERAL MAMMOGRAM WITH TOMOSYNTHESIS AND CAD
TECHNIQUE: Bilateral screening digital craniocaudal and mediolateral oblique
mammograms were obtained. Bilateral screening digital breast
tomosynthesis was performed. The images were evaluated with
computer-aided detection.

[L MLO synth-2D]
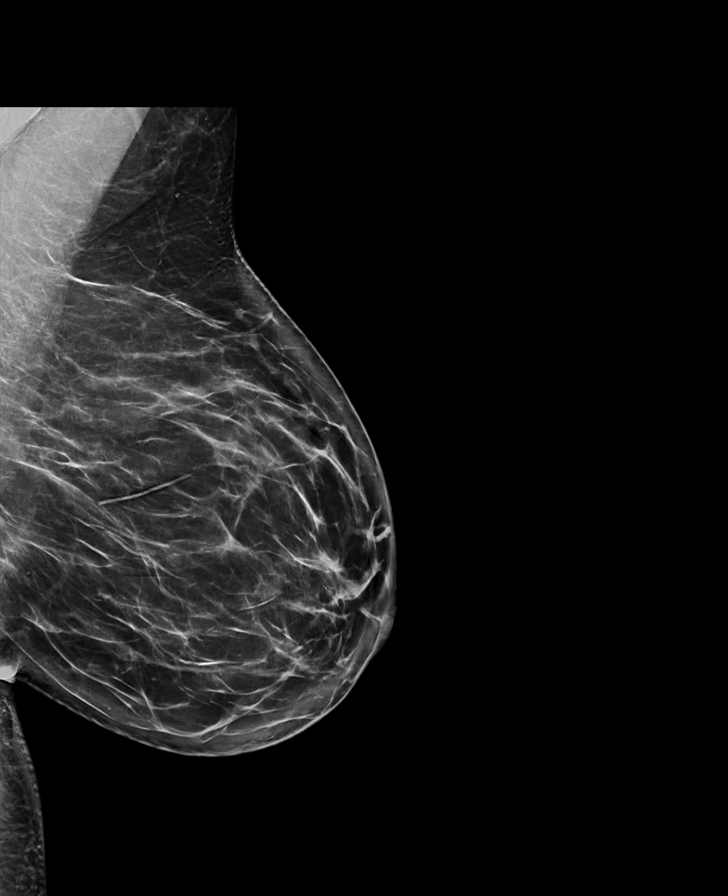

[R CC synth-2D]
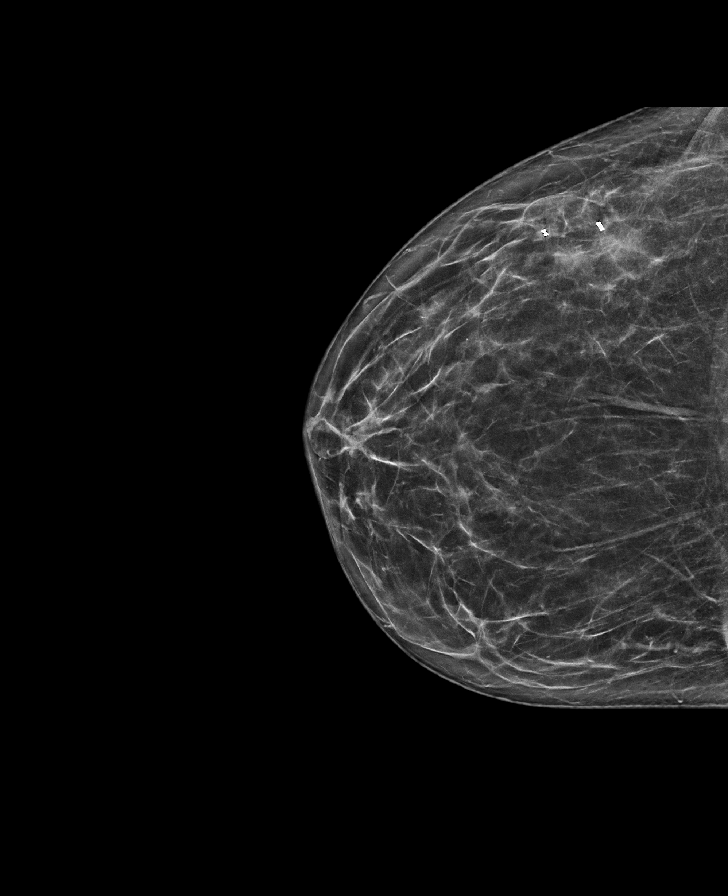

[R MLO synth-2D]
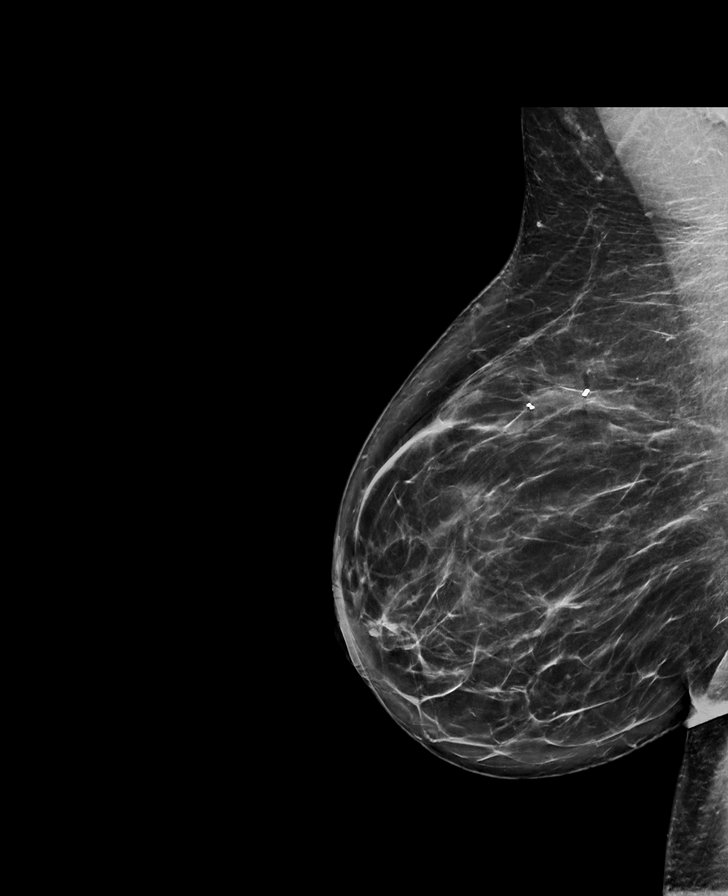

[L CC synth-2D]
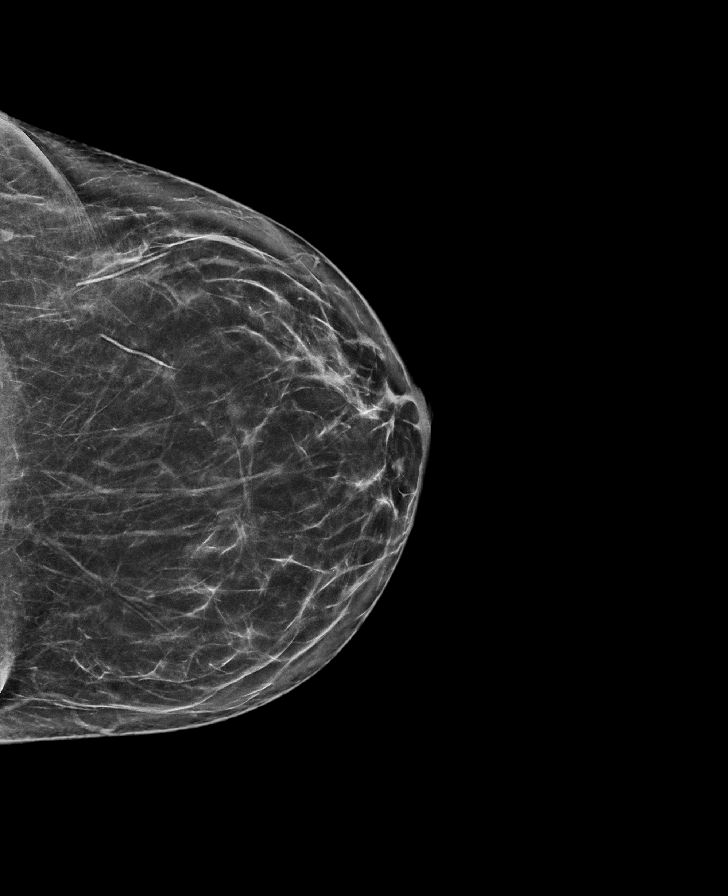

[R CC tomo · tomo slice 35/70.0]
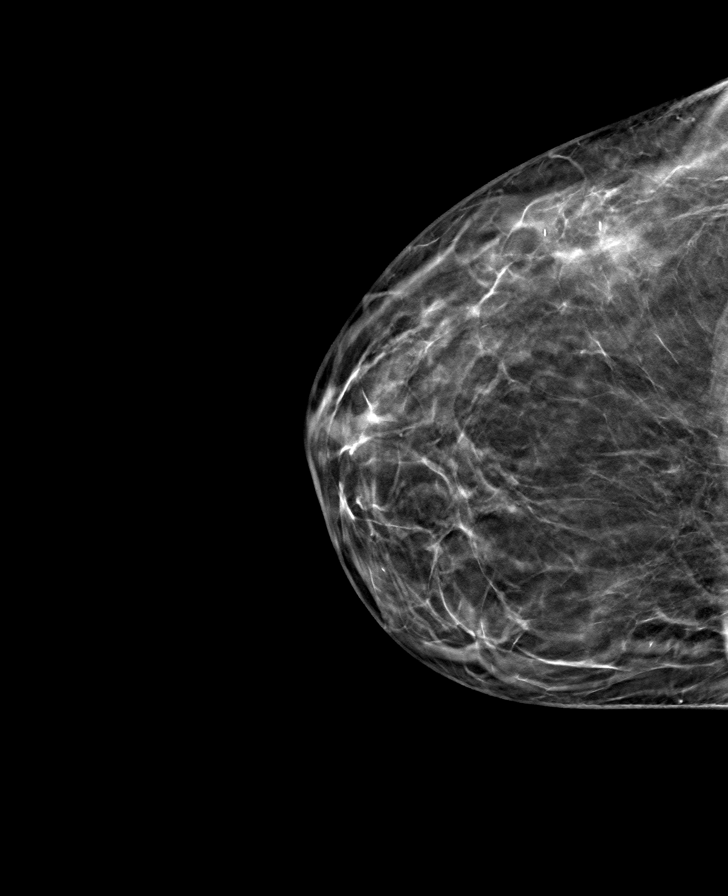

[L MLO tomo · tomo slice 45/88.0]
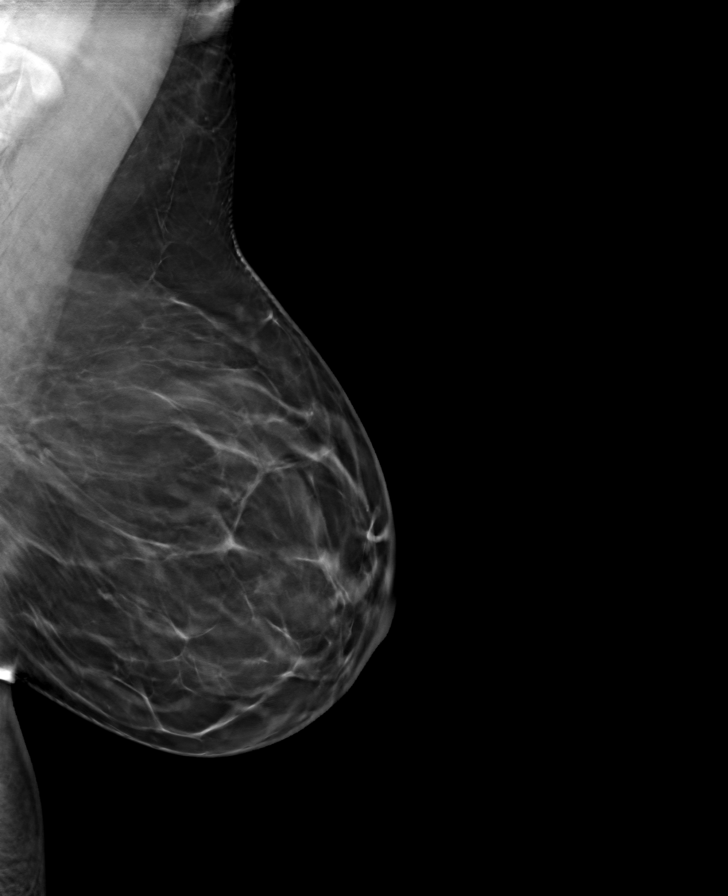

[L CC tomo · tomo slice 37/73.0]
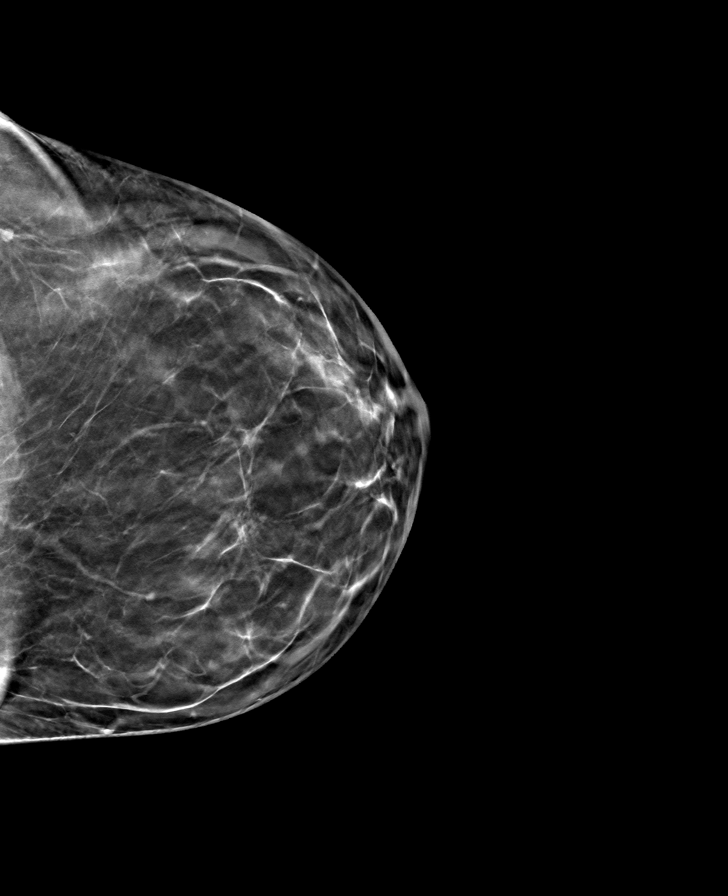

[R MLO tomo · tomo slice 45/88.0]
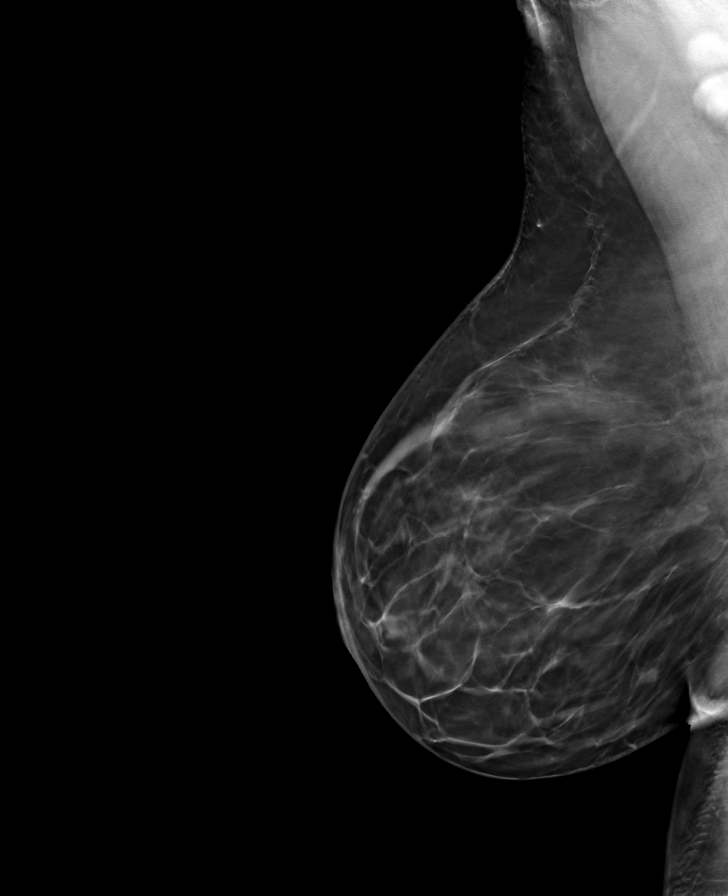

[8 of 24 positions shown; findings below may reference images not displayed]

ACR Breast Density Category b: There are scattered areas of
fibroglandular density.
FINDINGS: There are no findings suspicious for malignancy.
IMPRESSION: No mammographic evidence of malignancy. A result letter of this
screening mammogram will be mailed directly to the patient.

RECOMMENDATION:
Screening mammogram in one year. (Code:[BY])

BI-RADS CATEGORY  1: Negative.

## 2022-01-10 DIAGNOSIS — F411 Generalized anxiety disorder: Secondary | ICD-10-CM | POA: Diagnosis not present

## 2022-01-22 ENCOUNTER — Ambulatory Visit (INDEPENDENT_AMBULATORY_CARE_PROVIDER_SITE_OTHER): Payer: BC Managed Care – PPO | Admitting: Family Medicine

## 2022-01-22 ENCOUNTER — Other Ambulatory Visit: Payer: Self-pay

## 2022-01-22 ENCOUNTER — Encounter: Payer: Self-pay | Admitting: Family Medicine

## 2022-01-22 VITALS — BP 116/70 | HR 85 | Temp 98.6°F | Resp 16 | Wt 185.4 lb

## 2022-01-22 DIAGNOSIS — Z0000 Encounter for general adult medical examination without abnormal findings: Secondary | ICD-10-CM

## 2022-01-22 DIAGNOSIS — Z6833 Body mass index (BMI) 33.0-33.9, adult: Secondary | ICD-10-CM | POA: Diagnosis not present

## 2022-01-22 DIAGNOSIS — E611 Iron deficiency: Secondary | ICD-10-CM | POA: Diagnosis not present

## 2022-01-22 DIAGNOSIS — E669 Obesity, unspecified: Secondary | ICD-10-CM

## 2022-01-22 DIAGNOSIS — R7989 Other specified abnormal findings of blood chemistry: Secondary | ICD-10-CM | POA: Diagnosis not present

## 2022-01-22 NOTE — Progress Notes (Signed)
I,Sulibeya S Dimas,acting as a Education administrator for Lavon Paganini, MD.,have documented all relevant documentation on the behalf of Lavon Paganini, MD,as directed by  Lavon Paganini, MD while in the presence of Lavon Paganini, MD.   Complete physical exam   Patient: Kayla West   DOB: [birth year]   46 y.o. Female  MRN: [id number] Visit Date: 01/22/2022  Today's healthcare provider: Lavon Paganini, MD   Chief Complaint  Patient presents with   Annual Exam   Subjective    Kayla West is a 46 y.o. female who presents today for a complete physical exam.  She reports consuming a general diet. Home exercise routine includes garden. She generally feels well. She reports sleeping fairly well. She does not have additional problems to discuss today.  HPI    Past Medical History:  Diagnosis Date   Abnormal TSH 06/07/2017   3.8 on NOB labs and started on Synthroid. Recheck late August.   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   Endometrial polyp 12/08/2019   Family history of breast cancer    Family history of pancreatic cancer    History of pre-eclampsia 07/01/2017   Increased risk of breast cancer 04/2020   IBIS=24.9%/riskscore=16.5%   PONV (postoperative nausea and vomiting)    Positive ANA (antinuclear antibody) 02/22/2014   Sees Rheumatology, no meds   Symptomatic cholelithiasis    Past Surgical History:  Procedure Laterality Date   BREAST BIOPSY Right 06/21/2020   benign MR biopsy   BREAST LUMPECTOMY Left 2010   benign indication   CESAREAN SECTION     2019, 2015   CHOLECYSTECTOMY  10/20/2020   gallstones   [place] N/A 12/08/2019   Procedure: DILATATION & CURETTAGE/HYSTEROSCOPY WITH MYOSURE POLYPECOMTY;  Surgeon: Will Bonnet, MD;  Location: [facility] ORS;  Service: Gynecology;  Laterality: N/A;   Social History   Socioeconomic History   Marital status:  Married    Spouse name: james   Number of children: 3   Years of education: Not on file   Highest education level: Not on file  Occupational History   Occupation: professor  Tobacco Use   Smoking status: Never   Smokeless tobacco: Never  Vaping Use   Vaping Use: Never used  Substance and Sexual Activity   Alcohol use: Not Currently   Drug use: Never   Sexual activity: Yes    Partners: Male    Birth control/protection: None    Comment: Vasectomy  Other Topics Concern   Not on file  Social History Narrative   Patient lives with husband and 3 children.  Feels safe in her home   Social Determinants of Health   Financial Resource Strain: Not on file  Food Insecurity: Not on file  Transportation Needs: Not on file  Physical Activity: Not on file  Stress: Not on file  Social Connections: Not on file  Intimate Partner Violence: Not on file   Family Status  Relation Name Status   Mother  Deceased   Mat Aunt  Alive   MGM  Deceased   Mat Aunt  Alive   PGM  (Not Specified)   Neg Hx  (Not Specified)   Family History  Problem Relation Age of Onset   Pancreatic cancer Mother 72   Breast cancer Maternal Aunt        twice-50s and 60s   Breast cancer Maternal Grandmother  twice- 25s and 70s   Breast cancer Maternal Aunt 77   Diabetes Paternal Grandmother    Stroke Paternal Grandmother    Colon cancer Neg Hx    No Known Allergies  Patient Care Team: Virginia Crews, MD as PCP - General (Family Medicine)   Medications: Outpatient Medications Prior to Visit  Medication Sig   hydrOXYzine (ATARAX/VISTARIL) 10 MG tablet Take 1-2 tablets (10-20 mg total) by mouth 3 (three) times daily as needed.   Multiple Vitamin (MULTIVITAMIN WITH MINERALS) TABS tablet Take 1 tablet by mouth 4 (four) times a week.   Probiotic Product (PROBIOTIC PO) Take 1 capsule by mouth daily.   No facility-administered medications prior to visit.    Review of Systems  All other systems  reviewed and are negative.  Last CBC Lab Results  Component Value Date   WBC 9.1 04/28/2021   HGB 12.9 04/28/2021   HCT 41.1 04/28/2021   MCV 78 (L) 04/28/2021   MCH 24.6 (L) 04/28/2021   RDW 14.1 04/28/2021   PLT 385 33/82/5053   Last metabolic panel Lab Results  Component Value Date   GLUCOSE 84 01/19/2021   NA 140 01/19/2021   K 4.4 01/19/2021   CL 102 01/19/2021   CO2 23 01/19/2021   BUN 8 01/19/2021   CREATININE 0.76 01/19/2021   EGFR 100 01/19/2021   CALCIUM 9.8 01/19/2021   PROT 7.4 01/19/2021   ALBUMIN 4.6 01/19/2021   LABGLOB 2.8 01/19/2021   AGRATIO 1.6 01/19/2021   BILITOT 0.4 01/19/2021   ALKPHOS 74 01/19/2021   AST 18 01/19/2021   ALT 15 01/19/2021   Last lipids Lab Results  Component Value Date   CHOL 138 01/19/2021   HDL 46 01/19/2021   LDLCALC 78 01/19/2021   TRIG 69 01/19/2021   CHOLHDL 3.0 01/19/2021   Last hemoglobin A1c Lab Results  Component Value Date   HGBA1C 5.2 01/19/2021   Last thyroid functions Lab Results  Component Value Date   TSH 2.630 01/19/2021   Last vitamin D Lab Results  Component Value Date   VD25OH 31.7 01/19/2021   Last vitamin B12 and Folate Lab Results  Component Value Date   VITAMINB12 512 04/28/2021      Objective    BP 116/70 (BP Location: Left Arm, Patient Position: Sitting, Cuff Size: Large)    Pulse 85    Temp 98.6 F (37 C) (Temporal)    Resp 16    Wt 185 lb 6.4 oz (84.1 kg)    SpO2 (!) 84%    BMI 33.91 kg/m  BP Readings from Last 3 Encounters:  01/22/22 116/70  06/29/21 138/87  05/15/21 128/74   Wt Readings from Last 3 Encounters:  01/22/22 185 lb 6.4 oz (84.1 kg)  05/15/21 176 lb 6.4 oz (80 kg)  01/19/21 178 lb (80.7 kg)       Physical Exam Vitals reviewed.  Constitutional:      General: She is not in acute distress.    Appearance: Normal appearance. She is well-developed. She is not diaphoretic.  HENT:     Head: Normocephalic and atraumatic.     Right Ear: Tympanic membrane, ear  canal and external ear normal.     Left Ear: Tympanic membrane, ear canal and external ear normal.     Nose: Nose normal.     Mouth/Throat:     Mouth: Mucous membranes are moist.     Pharynx: Oropharynx is clear. No oropharyngeal exudate.  Eyes:  General: No scleral icterus.    Conjunctiva/sclera: Conjunctivae normal.     Pupils: Pupils are equal, round, and reactive to light.  Neck:     Thyroid: No thyromegaly.  Cardiovascular:     Rate and Rhythm: Normal rate and regular rhythm.     Pulses: Normal pulses.     Heart sounds: Normal heart sounds. No murmur heard. Pulmonary:     Effort: Pulmonary effort is normal. No respiratory distress.     Breath sounds: Normal breath sounds. No wheezing or rales.  Abdominal:     General: There is no distension.     Palpations: Abdomen is soft.     Tenderness: There is no abdominal tenderness.  Musculoskeletal:        General: No deformity.     Cervical back: Neck supple.     Right lower leg: No edema.     Left lower leg: No edema.  Lymphadenopathy:     Cervical: No cervical adenopathy.  Skin:    General: Skin is warm and dry.     Findings: No rash.  Neurological:     Mental Status: She is alert and oriented to person, place, and time. Mental status is at baseline.     Sensory: No sensory deficit.     Motor: No weakness.     Gait: Gait normal.  Psychiatric:        Mood and Affect: Mood normal.        Behavior: Behavior normal.        Thought Content: Thought content normal.      Last depression screening scores PHQ 2/9 Scores 01/22/2022 07/31/2021 01/19/2021  PHQ - 2 Score 0 0 0  PHQ- 9 Score $Remov'1 3 2   'zjBeGY$ Last fall risk screening Fall Risk  01/22/2022  Falls in the past year? 0  Number falls in past yr: 0  Injury with Fall? 0  Risk for fall due to : No Fall Risks  Follow up Falls evaluation completed   Last Audit-C alcohol use screening Alcohol Use Disorder Test (AUDIT) 01/22/2022  1. How often do you have a drink containing alcohol?  1  2. How many drinks containing alcohol do you have on a typical day when you are drinking? 0  3. How often do you have six or more drinks on one occasion? 0  AUDIT-C Score 1  Alcohol Brief Interventions/Follow-up -   A score of 3 or more in women, and 4 or more in men indicates increased risk for alcohol abuse, EXCEPT if all of the points are from question 1   No results found for any visits on 01/22/22.  Assessment & Plan    Routine Health Maintenance and Physical Exam  Exercise Activities and Dietary recommendations  Goals   None     Immunization History  Administered Date(s) Administered   Influenza Inj Mdck Quad Pf 09/27/2014, 01/17/2016, 10/08/2017   Influenza,inj,Quad PF,6+ Mos 09/01/2019, 09/02/2020   Influenza-Unspecified 09/27/2014, 01/17/2016, 10/08/2017   PFIZER(Purple Top)SARS-COV-2 Vaccination 01/21/2020, 02/11/2020   Tdap 08/16/2014, 11/07/2017    Health Maintenance  Topic Date Due   HIV Screening  Never done   COVID-19 Vaccine (3 - Booster for [place] series) 04/07/2020   INFLUENZA VACCINE  02/16/2022 (Originally 06/19/2021)   PAP SMEAR-Modifier  09/02/2024   TETANUS/TDAP  11/08/2027   Hepatitis C Screening  Completed   HPV VACCINES  Aged Out    Discussed health benefits of physical activity, and encouraged her to engage in regular exercise appropriate  for her age and condition.  Problem List Items Addressed This Visit       Other   Abnormal TSH    Recheck TSH Reflex free T4 if abnormal      Class 1 obesity without serious comorbidity with body mass index (BMI) of 32.0 to 32.9 in adult    Discussed importance of healthy weight management Discussed diet and exercise       Other Visit Diagnoses     Encounter for annual physical exam    -  Primary   Relevant Orders   CBC with Differential/Platelet   Comprehensive metabolic panel   Lipid Panel With LDL/HDL Ratio   Hemoglobin A1c   Iron deficiency       Relevant Orders   CBC with  Differential/Platelet   Iron, TIBC and Ferritin Panel        Return in about 1 year (around 01/23/2023) for CPE.     I, Lavon Paganini, MD, have reviewed all documentation for this visit. The documentation on 01/22/22 for the exam, diagnosis, procedures, and orders are all accurate and complete.   Lilliahna Schubring, Dionne Bucy, MD, MPH [facility]

## 2022-01-22 NOTE — Assessment & Plan Note (Signed)
Discussed importance of healthy weight management Discussed diet and exercise  

## 2022-01-22 NOTE — Assessment & Plan Note (Signed)
Recheck TSH ?Reflex free T4 if abnormal ?

## 2022-01-23 LAB — CBC WITH DIFFERENTIAL/PLATELET
Basophils Absolute: 0.1 10*3/uL (ref 0.0–0.2)
Basos: 1 %
EOS (ABSOLUTE): 0.2 10*3/uL (ref 0.0–0.4)
Eos: 2 %
Hematocrit: 40.1 % (ref 34.0–46.6)
Hemoglobin: 12.7 g/dL (ref 11.1–15.9)
Immature Grans (Abs): 0 10*3/uL (ref 0.0–0.1)
Immature Granulocytes: 0 %
Lymphocytes Absolute: 1.9 10*3/uL (ref 0.7–3.1)
Lymphs: 26 %
MCH: 25.1 pg — ABNORMAL LOW (ref 26.6–33.0)
MCHC: 31.7 g/dL (ref 31.5–35.7)
MCV: 79 fL (ref 79–97)
Monocytes Absolute: 0.6 10*3/uL (ref 0.1–0.9)
Monocytes: 8 %
Neutrophils Absolute: 4.6 10*3/uL (ref 1.4–7.0)
Neutrophils: 63 %
Platelets: 342 10*3/uL (ref 150–450)
RBC: 5.05 x10E6/uL (ref 3.77–5.28)
RDW: 13.7 % (ref 11.7–15.4)
WBC: 7.3 10*3/uL (ref 3.4–10.8)

## 2022-01-23 LAB — LIPID PANEL WITH LDL/HDL RATIO
Cholesterol, Total: 147 mg/dL (ref 100–199)
HDL: 51 mg/dL (ref >39)
LDL Chol Calc (NIH): 82 mg/dL (ref 0–99)
LDL/HDL Ratio: 1.6 ratio (ref 0.0–3.2)
Triglycerides: 73 mg/dL (ref 0–149)
VLDL Cholesterol Cal: 14 mg/dL (ref 5–40)

## 2022-01-23 LAB — IRON,TIBC AND FERRITIN PANEL
Ferritin: 15 ng/mL (ref 15–150)
Iron Saturation: 10 % — ABNORMAL LOW (ref 15–55)
Iron: 39 ug/dL (ref 27–159)
Total Iron Binding Capacity: 393 ug/dL (ref 250–450)
UIBC: 354 ug/dL (ref 131–425)

## 2022-01-23 LAB — COMPREHENSIVE METABOLIC PANEL
ALT: 15 IU/L (ref 0–32)
AST: 16 IU/L (ref 0–40)
Albumin/Globulin Ratio: 1.8 (ref 1.2–2.2)
Albumin: 4.8 g/dL (ref 3.8–4.8)
Alkaline Phosphatase: 71 IU/L (ref 44–121)
BUN/Creatinine Ratio: 12 (ref 9–23)
BUN: 10 mg/dL (ref 6–24)
Bilirubin Total: 0.6 mg/dL (ref 0.0–1.2)
CO2: 25 mmol/L (ref 20–29)
Calcium: 9.6 mg/dL (ref 8.7–10.2)
Chloride: 103 mmol/L (ref 96–106)
Creatinine, Ser: 0.86 mg/dL (ref 0.57–1.00)
Globulin, Total: 2.6 g/dL (ref 1.5–4.5)
Glucose: 92 mg/dL (ref 70–99)
Potassium: 4.2 mmol/L (ref 3.5–5.2)
Sodium: 139 mmol/L (ref 134–144)
Total Protein: 7.4 g/dL (ref 6.0–8.5)
eGFR: 86 mL/min/{1.73_m2} (ref >59)

## 2022-01-23 LAB — HEMOGLOBIN A1C
Est. average glucose Bld gHb Est-mCnc: 100 mg/dL
Hgb A1c MFr Bld: 5.1 % (ref 4.8–5.6)

## 2022-01-28 ENCOUNTER — Encounter: Payer: Self-pay | Admitting: Emergency Medicine

## 2022-01-28 ENCOUNTER — Ambulatory Visit
Admission: EM | Admit: 2022-01-28 | Discharge: 2022-01-28 | Disposition: A | Discharge status: Home / Self Care | Payer: BC Managed Care – PPO | Attending: Emergency Medicine | Admitting: Emergency Medicine

## 2022-01-28 DIAGNOSIS — H9203 Otalgia, bilateral: Secondary | ICD-10-CM | POA: Diagnosis not present

## 2022-01-28 DIAGNOSIS — B349 Viral infection, unspecified: Secondary | ICD-10-CM | POA: Diagnosis not present

## 2022-01-28 LAB — POCT RAPID STREP A (OFFICE): Rapid Strep A Screen: NEGATIVE

## 2022-01-28 NOTE — ED Triage Notes (Signed)
Pt here with sore throat, cough, headache, fever, otalgia x 3 days.  ?

## 2022-01-28 NOTE — Discharge Instructions (Addendum)
Your strep test is negative.  COVID and Flu tests are pending.   ? ?Take Tylenol or ibuprofen as needed for fever or discomfort.  Take plain Mucinex and use Flonase nasal spray as discussed.  Rest and keep yourself hydrated.   ? ?Follow-up with your primary care provider if your symptoms are not improving.   ? ? ?

## 2022-01-28 NOTE — ED Provider Notes (Addendum)
?[facility] ? ? ? ?CSN: [id number] ?Arrival date & time: 01/28/22  1154 ? ? ?  ? ?History   ?Chief Complaint ?Chief Complaint  ?Patient presents with  ? Sore Throat  ? Otalgia  ? Headache  ? Cough  ? Fever  ? ? ?HPI ?Kayla West is a 46 y.o. female.  Patient presents with 5-day history of fever, congestion, ear pain, sore throat, mild cough.  Tmax 103.  Treatment at home with Tylenol and ibuprofen.  Took Sudafed yesterday.  No rash, chest pain, shortness of breath, vomiting, diarrhea, or other symptoms. ? ?The history is provided by the patient and medical records.  ? ?Past Medical History:  ?Diagnosis Date  ? Abnormal TSH 06/07/2017  ? 3.8 on NOB labs and started on Synthroid. Recheck late August.  ? BRCA negative 04/2020  ? MyRisk neg  ? 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 Endometrial polyp 12/08/2019  ? Family history of breast cancer   ? Family history of pancreatic cancer   ? History of pre-eclampsia 07/01/2017  ? Increased risk of breast cancer 04/2020  ? IBIS=24.9%/riskscore=16.5%  ? PONV (postoperative nausea and vomiting)   ? Positive ANA (antinuclear antibody) 02/22/2014  ? Sees Rheumatology, no meds  ? Symptomatic cholelithiasis   ? ? ?Patient Active Problem List  ? Diagnosis Date Noted  ? Class 1 obesity without serious comorbidity with body mass index (BMI) of 32.0 to 32.9 in adult 01/19/2021  ? Abnormal TSH 06/07/2017  ? Malaise and fatigue 03/23/2013  ? ? ?Past Surgical History:  ?Procedure Laterality Date  ? BREAST BIOPSY Right 06/21/2020  ? benign MR biopsy  ? BREAST LUMPECTOMY Left 2010  ? benign indication  ? CESAREAN SECTION    ? 2019, 2015  ? CHOLECYSTECTOMY  10/20/2020  ? gallstones  ? DENTAL SURGERY    ? DILATATION & CURETTAGE/HYSTEROSCOPY WITH MYOSURE N/A 12/08/2019  ? Procedure: DILATATION & CURETTAGE/HYSTEROSCOPY WITH MYOSURE POLYPECOMTY;  Surgeon: Will Bonnet, MD;  Location: [facility] ORS;  Service: Gynecology;  Laterality: N/A;  ? ? ?OB History   ? ? Gravida  ?3  ?  Para  ?2  ? Term  ?2  ? Preterm  ?   ? AB  ?1  ? Living  ?3  ?  ? ? SAB  ?1  ? IAB  ?   ? Ectopic  ?   ? Multiple  ?1  ? Live Births  ?3  ?   ?  ?  ? ? ? ?Home Medications   ? ?Prior to Admission medications   ?Medication Sig Start Date End Date Taking? Authorizing Provider  ?hydrOXYzine (ATARAX/VISTARIL) 10 MG tablet Take 1-2 tablets (10-20 mg total) by mouth 3 (three) times daily as needed. 07/31/21   Birdie Sons, MD  ?Multiple Vitamin (MULTIVITAMIN WITH MINERALS) TABS tablet Take 1 tablet by mouth 4 (four) times a week.    [provider]  ?Probiotic Product (PROBIOTIC PO) Take 1 capsule by mouth daily.    [provider]  ? ? ?Family History ?Family History  ?Problem Relation Age of Onset  ? Pancreatic cancer Mother 39  ? Breast cancer Maternal Aunt   ?     twice-50s and 63s  ? Breast cancer Maternal Grandmother   ?     twice- 16s and 69s  ? Breast cancer Maternal Aunt 77  ? Diabetes Paternal Grandmother   ? Stroke Paternal Grandmother   ? Colon cancer Neg Hx   ? ? ?  Social History ?Social History  ? ?Tobacco Use  ? Smoking status: Never  ? Smokeless tobacco: Never  ?Vaping Use  ? Vaping Use: Never used  ?Substance Use Topics  ? Alcohol use: Not Currently  ? Drug use: Never  ? ? ? ?Allergies   ?Patient has no known allergies. ? ? ?Review of Systems ?Review of Systems  ?Constitutional:  Positive for fever. Negative for chills.  ?HENT:  Positive for congestion, ear pain, rhinorrhea and sore throat.   ?Respiratory:  Positive for cough. Negative for shortness of breath.   ?Cardiovascular:  Negative for chest pain and palpitations.  ?Gastrointestinal:  Negative for diarrhea and vomiting.  ?Skin:  Negative for color change and rash.  ?All other systems reviewed and are negative. ? ? ?Physical Exam ?Triage Vital Signs ?ED Triage Vitals [01/28/22 1213]  ?Enc Vitals Group  ?   BP 123/80  ?   Pulse Rate 98  ?   Resp 18  ?   Temp 98.1 ?F (36.7 ?C)  ?   Temp src   ?   SpO2 99 %  ?   Weight   ?   Height    ?   Head Circumference   ?   Peak Flow   ?   Pain Score   ?   Pain Loc   ?   Pain Edu?   ?   Excl. in [place]?   ? ?No data found. ? ?Updated Vital Signs ?BP 123/80   Pulse 98   Temp 98.1 ?F (36.7 ?C)   Resp 18   SpO2 99%  ? ?Visual Acuity ?Right Eye Distance:   ?Left Eye Distance:   ?Bilateral Distance:   ? ?Right Eye Near:   ?Left Eye Near:    ?Bilateral Near:    ? ?Physical Exam ?Vitals and nursing note reviewed.  ?Constitutional:   ?   General: She is not in acute distress. ?   Appearance: She is well-developed.  ?HENT:  ?   Right Ear: Tympanic membrane and ear canal normal.  ?   Left Ear: Tympanic membrane and ear canal normal.  ?   Nose: Rhinorrhea present.  ?   Mouth/Throat:  ?   Mouth: Mucous membranes are moist.  ?   Pharynx: Posterior oropharyngeal erythema present.  ?Cardiovascular:  ?   Rate and Rhythm: Normal rate and regular rhythm.  ?   Heart sounds: Normal heart sounds.  ?Pulmonary:  ?   Effort: Pulmonary effort is normal. No respiratory distress.  ?   Breath sounds: Normal breath sounds.  ?Musculoskeletal:  ?   Cervical back: Neck supple.  ?Skin: ?   General: Skin is warm and dry.  ?Neurological:  ?   Mental Status: She is alert.  ?Psychiatric:     ?   Mood and Affect: Mood normal.     ?   Behavior: Behavior normal.  ? ? ? ?UC Treatments / Results  ?Labs ?(all labs ordered are listed, but only abnormal results are displayed) ?Labs Reviewed  ?POCT RAPID STREP A (OFFICE) - Normal  ?COVID-19, FLU A+B NAA  ? ? ?EKG ? ? ?Radiology ?No results found. ? ?Procedures ?Procedures (including critical care time) ? ?Medications Ordered in UC ?Medications - No data to display ? ?Initial Impression / Assessment and Plan / UC Course  ?I have reviewed the triage vital signs and the nursing notes. ? ?Pertinent labs & imaging results that were available during my care of the patient were reviewed by  me and considered in my medical decision making (see chart for details). ? ?  ?Viral illness, acute otalgia.  Rapid  strep negative.  COVID and Flu pending.  Instructed patient to self quarantine per CDC guidelines.  Discussed symptomatic treatment including Mucinex, Flonase, Tylenol or ibuprofen, rest, hydration.  Instructed patient to follow up with PCP if symptoms are not improving.  Patient agrees to plan of care. ? ? ?Final Clinical Impressions(s) / UC Diagnoses  ? ?Final diagnoses:  ?Viral illness  ?Acute otalgia, bilateral  ? ? ? ?Discharge Instructions   ? ?  ?Your strep test is negative.  COVID and Flu tests are pending.   ? ?Take Tylenol or ibuprofen as needed for fever or discomfort.  Take plain Mucinex and use Flonase nasal spray as discussed.  Rest and keep yourself hydrated.   ? ?Follow-up with your primary care provider if your symptoms are not improving.   ? ? ? ? ? ? ?ED Prescriptions   ?None ?  ? ?PDMP not reviewed this encounter. ?  ?Sharion Balloon, NP ?01/28/22 1305 ? ?  ?Sharion Balloon, NP ?01/28/22 1308 ? ?

## 2022-01-29 LAB — COVID-19, FLU A+B NAA
Influenza A, NAA: NOT DETECTED
Influenza B, NAA: NOT DETECTED
SARS-CoV-2, NAA: NOT DETECTED

## 2022-02-07 ENCOUNTER — Other Ambulatory Visit: Payer: Self-pay

## 2022-02-07 ENCOUNTER — Ambulatory Visit
Admission: EM | Admit: 2022-02-07 | Discharge: 2022-02-07 | Disposition: A | Discharge status: Home / Self Care | Payer: BC Managed Care – PPO

## 2022-02-07 DIAGNOSIS — H6983 Other specified disorders of Eustachian tube, bilateral: Secondary | ICD-10-CM

## 2022-02-07 NOTE — ED Provider Notes (Signed)
?[facility] ? ? ? ?CSN: [id number] ?Arrival date & time: 02/07/22  1915 ? ? ?  ? ?History   ?Chief Complaint ?Chief Complaint  ?Patient presents with  ? Ear Fullness  ?  Entered by patient  ? ? ?HPI ?Kayla West is a 46 y.o. female.  ? ?Patient presents with bilateral ear fullness, right worse than the left for 10 days. worse at nighttime when lying down.  Initially pain was associated with the bilateral ears, resolved.  Fullness is described as sensation of being underwater with decreased hearing.  Feels as if her ears need to pop but won't.  Denies ear discharge, itching, fever, chills, nasal congestion, rhinorrhea.  Endorses that all accompanying symptoms from initial virus has resolved.  Has not attempted treatment of above symptoms. ? ? ?Past Medical History:  ?Diagnosis Date  ? Abnormal TSH 06/07/2017  ? 3.8 on NOB labs and started on Synthroid. Recheck late August.  ? BRCA negative 04/2020  ? MyRisk neg  ? 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 Endometrial polyp 12/08/2019  ? Family history of breast cancer   ? Family history of pancreatic cancer   ? History of pre-eclampsia 07/01/2017  ? Increased risk of breast cancer 04/2020  ? IBIS=24.9%/riskscore=16.5%  ? PONV (postoperative nausea and vomiting)   ? Positive ANA (antinuclear antibody) 02/22/2014  ? Sees Rheumatology, no meds  ? Symptomatic cholelithiasis   ? ? ?Patient Active Problem List  ? Diagnosis Date Noted  ? Family history of breast cancer 12/05/2021  ? Breast cancer screening, high risk patient 12/05/2021  ? Class 1 obesity without serious comorbidity with body mass index (BMI) of 32.0 to 32.9 in adult 01/19/2021  ? Abnormal TSH 06/07/2017  ? Malaise and fatigue 03/23/2013  ? ? ?Past Surgical History:  ?Procedure Laterality Date  ? BREAST BIOPSY Right 06/21/2020  ? benign MR biopsy  ? BREAST LUMPECTOMY Left 2010  ? benign indication  ? CESAREAN SECTION    ? 2019, 2015  ? CHOLECYSTECTOMY  10/20/2020  ? gallstones  ? DENTAL SURGERY     ? DILATATION & CURETTAGE/HYSTEROSCOPY WITH MYOSURE N/A 12/08/2019  ? Procedure: DILATATION & CURETTAGE/HYSTEROSCOPY WITH MYOSURE POLYPECOMTY;  Surgeon: Will Bonnet, MD;  Location: [facility] ORS;  Service: Gynecology;  Laterality: N/A;  ? ? ?OB History   ? ? Gravida  ?3  ? Para  ?2  ? Term  ?2  ? Preterm  ?   ? AB  ?1  ? Living  ?3  ?  ? ? SAB  ?1  ? IAB  ?   ? Ectopic  ?   ? Multiple  ?1  ? Live Births  ?3  ?   ?  ?  ? ? ? ?Home Medications   ? ?Prior to Admission medications   ?Medication Sig Start Date End Date Taking? Authorizing Provider  ?hydrOXYzine (ATARAX/VISTARIL) 10 MG tablet Take 1-2 tablets (10-20 mg total) by mouth 3 (three) times daily as needed. 07/31/21   Birdie Sons, MD  ?Multiple Vitamin (MULTIVITAMIN WITH MINERALS) TABS tablet Take 1 tablet by mouth 4 (four) times a week.    [provider]  ?Probiotic Product (PROBIOTIC PO) Take 1 capsule by mouth daily.    [provider]  ? ? ?Family History ?Family History  ?Problem Relation Age of Onset  ? Pancreatic cancer Mother 48  ? Breast cancer Maternal Aunt   ?     twice-50s and 41s  ? Breast cancer  Maternal Grandmother   ?     twice- 50s and 96s  ? Breast cancer Maternal Aunt 77  ? Diabetes Paternal Grandmother   ? Stroke Paternal Grandmother   ? Colon cancer Neg Hx   ? ? ?Social History ?Social History  ? ?Tobacco Use  ? Smoking status: Never  ? Smokeless tobacco: Never  ?Vaping Use  ? Vaping Use: Never used  ?Substance Use Topics  ? Alcohol use: Not Currently  ? Drug use: Never  ? ? ? ?Allergies   ?Patient has no known allergies. ? ? ?Review of Systems ?Review of Systems ?Defer to HPI  ? ? ?Physical Exam ?Triage Vital Signs ?ED Triage Vitals  ?Enc Vitals Group  ?   BP 02/07/22 1922 119/65  ?   Pulse Rate 02/07/22 1922 66  ?   Resp 02/07/22 1922 18  ?   Temp 02/07/22 1922 98.4 ?F (36.9 ?C)  ?   Temp Source 02/07/22 1922 Oral  ?   SpO2 02/07/22 1922 100 %  ?   Weight 02/07/22 1920 185 lb (83.9 kg)  ?   Height 02/07/22 1920  _0  (1.575 m)  ?   Head Circumference --   ?   Peak Flow --   ?   Pain Score 02/07/22 1917 0  ?   Pain Loc --   ?   Pain Edu? --   ?   Excl. in [place]? --   ? ?No data found. ? ?Updated Vital Signs ?BP 119/65 (BP Location: Left Arm)   Pulse 66   Temp 98.4 ?F (36.9 ?C) (Oral)   Resp 18   Ht _1  (1.575 m)   Wt 185 lb (83.9 kg)   LMP 01/18/2022 (Exact Date)   SpO2 100%   BMI 33.84 kg/m?  ? ?Visual Acuity ?Right Eye Distance:   ?Left Eye Distance:   ?Bilateral Distance:   ? ?Right Eye Near:   ?Left Eye Near:    ?Bilateral Near:    ? ?Physical Exam ?Constitutional:   ?   Appearance: Normal appearance.  ?HENT:  ?   Head: Normocephalic.  ?   Right Ear: Hearing, ear canal and external ear normal. A middle ear effusion is present.  ?   Left Ear: Hearing, ear canal and external ear normal. A middle ear effusion is present.  ?Eyes:  ?   Extraocular Movements: Extraocular movements intact.  ?Pulmonary:  ?   Effort: Pulmonary effort is normal.  ?Skin: ?   General: Skin is warm and dry.  ?Neurological:  ?   Mental Status: She is alert and oriented to person, place, and time. Mental status is at baseline.  ?Psychiatric:     ?   Mood and Affect: Mood normal.     ?   Behavior: Behavior normal.  ? ? ? ?UC Treatments / Results  ?Labs ?(all labs ordered are listed, but only abnormal results are displayed) ?Labs Reviewed - No data to display ? ?EKG ? ? ?Radiology ?No results found. ? ?Procedures ?Procedures (including critical care time) ? ?Medications Ordered in UC ?Medications - No data to display ? ?Initial Impression / Assessment and Plan / UC Course  ?I have reviewed the triage vital signs and the nursing notes. ? ?Pertinent labs & imaging results that were available during my care of the patient were reviewed by me and considered in my medical decision making (see chart for details). ? ?Station tube dysfunction, bilateral ? ?Discussed etiology of symptoms, timeline and  possible resolution, recommended use of  over-the-counter Flonase, Mucinex and an antihistamine for management of symptoms suspicion of bacterial presents as symptoms began as a viral infection, completed viral testing 10 days ago in urgent care setting, will not repeat today, may follow-up with urgent care as needed if symptoms continue to persist ?Final Clinical Impressions(s) / UC Diagnoses  ? ?Final diagnoses:  ?None  ? ?Discharge Instructions   ?None ?  ? ?ED Prescriptions   ?None ?  ? ?PDMP not reviewed this encounter. ?  ?Hans Eden, NP ?02/07/22 1940 ? ?

## 2022-02-07 NOTE — ED Triage Notes (Signed)
Patient is here for "Ear Fullness". Recent URI (in family). About a week since illness "& now, no pain, but feels like ears are under water' (especially right). No fever.  ?

## 2022-02-07 NOTE — Discharge Instructions (Addendum)
Your symptoms are a result of fluid sitting behind your tympanic membrane related to your recent infection, typically this issue resolves on its own and should steadily get better with time ? ?Treatment is aimed at helping to reduce the amount of secretions and moving secretions from your sinus cavity ? ?Begin use of Flonase every morning and every evening, this medication is a steroid and will help reduce inflammation as well as reduce secretions present, if you are able to taste the medicine it is going in the wrong direction, redo ? ?Begin use of Mucinex (guaifenesin, Robitussin), this medication is to help thin secretions and allow them to drain ? ?Begin use of a daily antihistamine such as Claritin or Zyrtec, this medication is to reduce the secretions produced by the body ? ?You may follow-up with urgent care as needed ? ? ?

## 2022-06-04 DIAGNOSIS — Z86018 Personal history of other benign neoplasm: Secondary | ICD-10-CM | POA: Diagnosis not present

## 2022-06-04 DIAGNOSIS — Z872 Personal history of diseases of the skin and subcutaneous tissue: Secondary | ICD-10-CM | POA: Diagnosis not present

## 2022-06-04 DIAGNOSIS — L578 Other skin changes due to chronic exposure to nonionizing radiation: Secondary | ICD-10-CM | POA: Diagnosis not present

## 2022-07-09 ENCOUNTER — Ambulatory Visit
Admission: EM | Admit: 2022-07-09 | Discharge: 2022-07-09 | Disposition: A | Discharge status: Home / Self Care | Payer: BC Managed Care – PPO | Attending: Emergency Medicine | Admitting: Emergency Medicine

## 2022-07-09 DIAGNOSIS — F410 Panic disorder [episodic paroxysmal anxiety] without agoraphobia: Secondary | ICD-10-CM

## 2022-07-09 DIAGNOSIS — F419 Anxiety disorder, unspecified: Secondary | ICD-10-CM

## 2022-07-09 MED ORDER — HYDROXYZINE HCL 25 MG PO TABS: 25.0000 mg | ORAL_TABLET | Freq: Four times a day (QID) | ORAL | 0 refills | Status: DC | PRN

## 2022-07-09 NOTE — ED Provider Notes (Signed)
HPI  SUBJECTIVE:  Kayla West is a 46 y.o. female who presents with an anxiety/panic attack today immediately after getting some bad news from her child's school earlier today.  She states that she spiraled out of control.  She reports generalized heaviness, chest tightness, shortness of breath,  feeling tense, nausea, feeling shaky and overwhelmed, sour stomach, with water brash.  It lasted hours and was much worse than her usual panic attacks.  She states it took a long time to recover, and she is exhausted.  She states this is identical to previous anxiety/panic attacks.  No sense of impending doom, chest pain, diaphoresis.  She checked her blood pressure, heart rate and oxygen saturation.  Her blood pressure was high, but it has since improved.  Her heart rate was below 100 and her oxygen saturation was normal at home.  She tried deep breathing with improvement in her symptoms.  Symptoms tend to be triggered when she gets bad news.  She denies calf pain, swelling, hemoptysis, recent surgery, immobilization, long trips, exogenous estrogen.  She has a past medical history of anxiety and has been prescribed 10 mg of hydroxyzine in the past.  She has a therapist and has emailed her today.  No history of MI, coronary disease, diabetes, hypertension, hypercholesterolemia, CVA, PAD/PVD, smoking, PE, DVT.  LMP: 8/12.  Denies the possibility of being pregnant.  PCP: [facility].    Past Medical History:  Diagnosis Date   Abnormal TSH 06/07/2017   3.8 on NOB labs and started on Synthroid. Recheck late August.   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   Endometrial polyp 12/08/2019   Family history of breast cancer    Family history of pancreatic cancer    History of pre-eclampsia 07/01/2017   Increased risk of breast cancer 04/2020   IBIS=24.9%/riskscore=16.5%   PONV (postoperative nausea and vomiting)    Positive ANA (antinuclear antibody)  02/22/2014   Sees Rheumatology, no meds   Symptomatic cholelithiasis     Past Surgical History:  Procedure Laterality Date   BREAST BIOPSY Right 06/21/2020   benign MR biopsy   BREAST LUMPECTOMY Left 2010   benign indication   CESAREAN SECTION     2019, 2015   CHOLECYSTECTOMY  10/20/2020   gallstones   [place] N/A 12/08/2019   Procedure: DILATATION & CURETTAGE/HYSTEROSCOPY WITH MYOSURE POLYPECOMTY;  Surgeon: Will Bonnet, MD;  Location: [facility] ORS;  Service: Gynecology;  Laterality: N/A;    Family History  Problem Relation Age of Onset   Pancreatic cancer Mother 66   Breast cancer Maternal Aunt        twice-50s and 68s   Breast cancer Maternal Grandmother        twice- 35s and 19s   Breast cancer Maternal Aunt 77   Diabetes Paternal Grandmother    Stroke Paternal Grandmother    Colon cancer Neg Hx     Social History   Tobacco Use   Smoking status: Never   Smokeless tobacco: Never  Vaping Use   Vaping Use: Never used  Substance Use Topics   Alcohol use: Not Currently   Drug use: Never    No current facility-administered medications for this encounter.  Current Outpatient Medications:    Multiple Vitamin (MULTIVITAMIN WITH MINERALS) TABS tablet, Take 1 tablet by mouth 4 (four) times a week., Disp: , Rfl:    Probiotic Product (PROBIOTIC  PO), Take 1 capsule by mouth daily., Disp: , Rfl:    hydrOXYzine (ATARAX) 25 MG tablet, Take 1 tablet (25 mg total) by mouth every 6 (six) hours as needed for anxiety. May take an additional tablet if needed, Disp: 20 tablet, Rfl: 0  No Known Allergies   ROS  As noted in HPI.   Physical Exam  BP 121/79 (BP Location: Left Arm)   Pulse 76   Temp 98.1 F (36.7 C) (Oral)   Ht $R'5\' 2"'Jj$  (1.575 m)   Wt 81.6 kg   LMP 06/30/2022 (Exact Date)   SpO2 98%   BMI 32.92 kg/m   Constitutional: Well developed, well nourished, no acute distress Eyes:  EOMI, conjunctiva  normal bilaterally HENT: Normocephalic, atraumatic,mucus membranes moist Respiratory: Normal inspiratory effort lungs clear bilaterally Cardiovascular: Normal rate, regular rhythm, no murmurs rubs or gallops GI: nondistended skin: No rash, skin intact Musculoskeletal: Calves symmetric, nontender, no edema. Neurologic: Alert & oriented x 3, no focal neuro deficits Psychiatric: Speech and behavior appropriate   ED Course   Medications - No data to display  No orders of the defined types were placed in this encounter.   No results found for this or any previous visit (from the past 24 hour(s)). No results found.  ED Clinical Impression  1. Panic attack   2. Anxiety      ED Assessment/Plan  Believe that the patient had a severe panic attack as it had a distinct trigger, and it followed follow the same pattern that previous panic attacks have followed.  Her vitals are normal.  She is lucid, has no no suicidal homicidal ideation, I doubt that she had ACS or has a PE.   Will try her on hydroxyzine 25 mg 3 times daily, and she is arranging follow-up with her therapist.  Advised her to get a visit with anyone in her primary care provider's office ASAP to discuss the possibility of being started on a long-term medication for anxiety.  Strict ER return precautions given.  Discussed MDM, treatment plan, and plan for follow-up with patient. Discussed sn/sx that should prompt return to the ED. patient agrees with plan.   Meds ordered this encounter  Medications   hydrOXYzine (ATARAX) 25 MG tablet    Sig: Take 1 tablet (25 mg total) by mouth every 6 (six) hours as needed for anxiety. May take an additional tablet if needed    Dispense:  20 tablet    Refill:  0      *This clinic note was created using [employer]. Therefore, there may be occasional mistakes despite careful proofreading.  ?    Melynda Ripple, MD 07/09/22 1945

## 2022-07-09 NOTE — ED Triage Notes (Signed)
Pt c/o panic attack/anxiety attack, pt states she has had this in the past, but today was attack. Pt states started around 2:45pm, nausea

## 2022-07-09 NOTE — Discharge Instructions (Addendum)
Try the hydroxyzine if this happens again.  You may take an additional 25 mg of hydroxyzine if the symptoms persist.  Please follow-up with your therapist as planned and follow-up with anyone in your primary care provider's office to discuss possible long-term medication for anxiety.

## 2022-07-26 DIAGNOSIS — F411 Generalized anxiety disorder: Secondary | ICD-10-CM | POA: Diagnosis not present

## 2022-08-06 ENCOUNTER — Other Ambulatory Visit: Payer: Self-pay | Admitting: Obstetrics and Gynecology

## 2022-08-06 DIAGNOSIS — Z853 Personal history of malignant neoplasm of breast: Secondary | ICD-10-CM

## 2022-09-09 ENCOUNTER — Ambulatory Visit
Admission: RE | Admit: 2022-09-09 | Discharge: 2022-09-09 | Disposition: A | Discharge status: Home / Self Care | Payer: BC Managed Care – PPO | Source: Ambulatory Visit | Attending: Obstetrics and Gynecology | Admitting: Obstetrics and Gynecology

## 2022-09-09 DIAGNOSIS — Z853 Personal history of malignant neoplasm of breast: Secondary | ICD-10-CM

## 2022-09-09 DIAGNOSIS — Z1239 Encounter for other screening for malignant neoplasm of breast: Secondary | ICD-10-CM | POA: Diagnosis not present

## 2022-09-09 MED ORDER — GADOPICLENOL 0.5 MMOL/ML IV SOLN
8.0000 mL | Freq: Once | INTRAVENOUS | Status: AC | PRN
  Administered 2022-09-09: 8 mL via INTRAVENOUS

## 2022-10-16 DIAGNOSIS — M546 Pain in thoracic spine: Secondary | ICD-10-CM | POA: Diagnosis not present

## 2022-10-16 DIAGNOSIS — M9902 Segmental and somatic dysfunction of thoracic region: Secondary | ICD-10-CM | POA: Diagnosis not present

## 2022-10-16 DIAGNOSIS — M9903 Segmental and somatic dysfunction of lumbar region: Secondary | ICD-10-CM | POA: Diagnosis not present

## 2022-10-16 DIAGNOSIS — M5416 Radiculopathy, lumbar region: Secondary | ICD-10-CM | POA: Diagnosis not present

## 2022-11-26 ENCOUNTER — Other Ambulatory Visit: Payer: Self-pay | Admitting: Obstetrics and Gynecology

## 2022-11-26 DIAGNOSIS — Z1231 Encounter for screening mammogram for malignant neoplasm of breast: Secondary | ICD-10-CM

## 2022-12-05 DIAGNOSIS — Z872 Personal history of diseases of the skin and subcutaneous tissue: Secondary | ICD-10-CM | POA: Diagnosis not present

## 2022-12-05 DIAGNOSIS — L578 Other skin changes due to chronic exposure to nonionizing radiation: Secondary | ICD-10-CM | POA: Diagnosis not present

## 2022-12-05 DIAGNOSIS — L218 Other seborrheic dermatitis: Secondary | ICD-10-CM | POA: Diagnosis not present

## 2022-12-05 DIAGNOSIS — Z86018 Personal history of other benign neoplasm: Secondary | ICD-10-CM | POA: Diagnosis not present

## 2023-01-02 DIAGNOSIS — Z01411 Encounter for gynecological examination (general) (routine) with abnormal findings: Secondary | ICD-10-CM | POA: Diagnosis not present

## 2023-01-02 DIAGNOSIS — N889 Noninflammatory disorder of cervix uteri, unspecified: Secondary | ICD-10-CM | POA: Diagnosis not present

## 2023-01-02 DIAGNOSIS — Z124 Encounter for screening for malignant neoplasm of cervix: Secondary | ICD-10-CM | POA: Diagnosis not present

## 2023-01-02 DIAGNOSIS — Z1331 Encounter for screening for depression: Secondary | ICD-10-CM | POA: Diagnosis not present

## 2023-01-02 DIAGNOSIS — Z1339 Encounter for screening examination for other mental health and behavioral disorders: Secondary | ICD-10-CM | POA: Diagnosis not present

## 2023-01-02 LAB — RESULTS CONSOLE HPV: CHL HPV: NEGATIVE

## 2023-01-02 LAB — HM PAP SMEAR: HM Pap smear: NEGATIVE

## 2023-01-14 ENCOUNTER — Ambulatory Visit
Admission: RE | Admit: 2023-01-14 | Discharge: 2023-01-14 | Disposition: A | Discharge status: Home / Self Care | Payer: BC Managed Care – PPO | Source: Ambulatory Visit

## 2023-01-14 DIAGNOSIS — Z1231 Encounter for screening mammogram for malignant neoplasm of breast: Secondary | ICD-10-CM | POA: Diagnosis not present

## 2023-01-25 NOTE — Progress Notes (Unsigned)
I,Stevin Bielinski S Arrow Emmerich,acting as a Education administrator for Lavon Paganini, MD.,have documented all relevant documentation on the behalf of Lavon Paganini, MD,as directed by  Lavon Paganini, MD while in the presence of Lavon Paganini, MD.    Complete physical exam   Patient: Kayla West   DOB: 06/28/1976   47 y.o. Female  MRN: [id number] Visit Date: 01/28/2023  Today's healthcare provider: Lavon Paganini, MD   Chief Complaint  Patient presents with   Annual Exam   Subjective    Kayla West is a 47 y.o. female who presents today for a complete physical exam.  She reports consuming a general diet. The patient does not participate in regular exercise at present. gardening  She generally feels fairly well. She reports sleeping fairly well. She does not have additional problems to discuss today.  HPI    Past Medical History:  Diagnosis Date   Abnormal TSH 06/07/2017   3.8 on NOB labs and started on Synthroid. Recheck late August.   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   Endometrial polyp 12/08/2019   Family history of breast cancer    Family history of pancreatic cancer    History of pre-eclampsia 07/01/2017   Increased risk of breast cancer 04/2020   IBIS=24.9%/riskscore=16.5%   PONV (postoperative nausea and vomiting)    Positive ANA (antinuclear antibody) 02/22/2014   Sees Rheumatology, no meds   Symptomatic cholelithiasis    Past Surgical History:  Procedure Laterality Date   BREAST BIOPSY Right 06/21/2020   benign MR biopsy   BREAST LUMPECTOMY Left 2010   benign indication   CESAREAN SECTION     2019, 2015   CHOLECYSTECTOMY  10/20/2020   gallstones   [place] N/A 12/08/2019   Procedure: DILATATION & CURETTAGE/HYSTEROSCOPY WITH MYOSURE POLYPECOMTY;  Surgeon: Will Bonnet, MD;  Location: [facility] ORS;  Service: Gynecology;  Laterality: N/A;   Social History    Socioeconomic History   Marital status: Married    Spouse name: james   Number of children: 3   Years of education: Not on file   Highest education level: Not on file  Occupational History   Occupation: professor  Tobacco Use   Smoking status: Never   Smokeless tobacco: Never  Vaping Use   Vaping Use: Never used  Substance and Sexual Activity   Alcohol use: Not Currently   Drug use: Never   Sexual activity: Yes    Partners: Male    Birth control/protection: None    Comment: Vasectomy  Other Topics Concern   Not on file  Social History Narrative   Patient lives with husband and 3 children.  Feels safe in her home   Social Determinants of Health   Financial Resource Strain: Not on file  Food Insecurity: Not on file  Transportation Needs: Not on file  Physical Activity: Not on file  Stress: Not on file  Social Connections: Not on file  Intimate Partner Violence: Not on file   Family Status  Relation Name Status   Mother  Deceased   Mat Aunt  Alive   MGM  Deceased   Mat Aunt  Alive   PGM  (Not Specified)   Neg Hx  (Not Specified)   Family History  Problem Relation Age of Onset   Pancreatic cancer Mother 31   Breast cancer Maternal Aunt        twice-50s and 73s  Breast cancer Maternal Grandmother        twice- 60s and 70s   Breast cancer Maternal Aunt 77   Diabetes Paternal Grandmother    Stroke Paternal Grandmother    Colon cancer Neg Hx    No Known Allergies  Patient Care Team: Virginia Crews, MD as PCP - General (Family Medicine)   Medications: Outpatient Medications Prior to Visit  Medication Sig   hydrOXYzine (ATARAX) 25 MG tablet Take 1 tablet (25 mg total) by mouth every 6 (six) hours as needed for anxiety. May take an additional tablet if needed   Multiple Vitamin (MULTIVITAMIN WITH MINERALS) TABS tablet Take 1 tablet by mouth 4 (four) times a week.   Probiotic Product (PROBIOTIC PO) Take 1 capsule by mouth daily.   No  facility-administered medications prior to visit.    Review of Systems  Last CBC Lab Results  Component Value Date   WBC 7.3 01/22/2022   HGB 12.7 01/22/2022   HCT 40.1 01/22/2022   MCV 79 01/22/2022   MCH 25.1 (L) 01/22/2022   RDW 13.7 01/22/2022   PLT 342 [id number]   Last metabolic panel Lab Results  Component Value Date   GLUCOSE 92 01/22/2022   NA 139 01/22/2022   K 4.2 01/22/2022   CL 103 01/22/2022   CO2 25 01/22/2022   BUN 10 01/22/2022   CREATININE 0.86 01/22/2022   EGFR 86 01/22/2022   CALCIUM 9.6 01/22/2022   PROT 7.4 01/22/2022   ALBUMIN 4.8 01/22/2022   LABGLOB 2.6 01/22/2022   AGRATIO 1.8 01/22/2022   BILITOT 0.6 01/22/2022   ALKPHOS 71 01/22/2022   AST 16 01/22/2022   ALT 15 01/22/2022   Last lipids Lab Results  Component Value Date   CHOL 147 01/22/2022   HDL 51 01/22/2022   LDLCALC 82 01/22/2022   TRIG 73 01/22/2022   CHOLHDL 3.0 01/19/2021   Last hemoglobin A1c Lab Results  Component Value Date   HGBA1C 5.1 01/22/2022   Last thyroid functions Lab Results  Component Value Date   TSH 2.630 01/19/2021   Last vitamin D Lab Results  Component Value Date   VD25OH 31.7 01/19/2021   Last vitamin B12 and Folate Lab Results  Component Value Date   VITAMINB12 512 04/28/2021      Objective    BP 112/77 (BP Location: Left Arm, Patient Position: Sitting, Cuff Size: Large)   Pulse 77   Temp 98 F (36.7 C) (Oral)   Resp 16   Ht '5\' 2"'$  (1.575 m)   Wt 194 lb (88 kg)   LMP 01/18/2023 (Exact Date)   BMI 35.48 kg/m  BP Readings from Last 3 Encounters:  01/28/23 112/77  07/09/22 121/79  02/07/22 119/65   Wt Readings from Last 3 Encounters:  01/28/23 194 lb (88 kg)  07/09/22 180 lb (81.6 kg)  02/07/22 185 lb (83.9 kg)      Physical Exam Vitals reviewed.  Constitutional:      General: She is not in acute distress.    Appearance: Normal appearance. She is well-developed. She is not diaphoretic.  HENT:     Head: Normocephalic and  atraumatic.     Right Ear: Tympanic membrane, ear canal and external ear normal.     Left Ear: Tympanic membrane, ear canal and external ear normal.     Nose: Nose normal.     Mouth/Throat:     Mouth: Mucous membranes are moist.     Pharynx: Oropharynx is clear. No oropharyngeal exudate.  Eyes:     General: No scleral icterus.    Conjunctiva/sclera: Conjunctivae normal.     Pupils: Pupils are equal, round, and reactive to light.  Neck:     Thyroid: No thyromegaly.  Cardiovascular:     Rate and Rhythm: Normal rate and regular rhythm.     Pulses: Normal pulses.     Heart sounds: Normal heart sounds. No murmur heard. Pulmonary:     Effort: Pulmonary effort is normal. No respiratory distress.     Breath sounds: Normal breath sounds. No wheezing or rales.  Abdominal:     General: There is no distension.     Palpations: Abdomen is soft.     Tenderness: There is no abdominal tenderness.  Musculoskeletal:        General: No deformity.     Cervical back: Neck supple.     Right lower leg: No edema.     Left lower leg: No edema.  Lymphadenopathy:     Cervical: No cervical adenopathy.  Skin:    General: Skin is warm and dry.     Findings: No rash.  Neurological:     Mental Status: She is alert and oriented to person, place, and time. Mental status is at baseline.     Gait: Gait normal.  Psychiatric:        Mood and Affect: Mood normal.        Behavior: Behavior normal.        Thought Content: Thought content normal.       Last depression screening scores    01/28/2023    8:40 AM 01/22/2022   10:07 AM 07/31/2021   12:56 PM  PHQ 2/9 Scores  PHQ - 2 Score 0 0 0  PHQ- 9 Score '1 1 3   '$ Last fall risk screening    01/28/2023    8:40 AM  [place] in the past year? 0  Number falls in past yr: 0  Injury with Fall? 0  Risk for fall due to : No Fall Risks  Follow up Falls evaluation completed   Last Audit-C alcohol use screening    01/28/2023    8:40 AM  Alcohol Use  Disorder Test (AUDIT)  1. How often do you have a drink containing alcohol? 1  2. How many drinks containing alcohol do you have on a typical day when you are drinking? 0  3. How often do you have six or more drinks on one occasion? 0  AUDIT-C Score 1   A score of 3 or more in women, and 4 or more in men indicates increased risk for alcohol abuse, EXCEPT if all of the points are from question 1   Results for orders placed or performed in visit on 01/28/23  HIV Antibody (routine testing w rflx)  Result Value Ref Range   HIV 1&2 Ab, 4th Generation negative   HM PAP SMEAR  Result Value Ref Range   HM Pap smear negative   Results Console HPV  Result Value Ref Range   CHL HPV Negative     Assessment & Plan    Routine Health Maintenance and Physical Exam  Exercise Activities and Dietary recommendations  Goals   None     Immunization History  Administered Date(s) Administered   Influenza Inj Mdck Quad Pf 09/27/2014, 01/17/2016, 10/08/2017   Influenza,inj,Quad PF,6+ Mos 09/01/2019, 09/02/2020   Influenza-Unspecified 09/27/2014, 01/17/2016, 10/08/2017   PFIZER(Purple Top)SARS-COV-2 Vaccination 01/21/2020, 02/11/2020   Pfizer Fall 2023 Covid-19  Vaccine 39yr thru 139yr 10/14/2020   Tdap 08/16/2014, 11/07/2017    Health Maintenance  Topic Date Due   COVID-19 Vaccine (3 - 2023-24 season) 07/20/2022   INFLUENZA VACCINE  02/17/2023 (Originally 06/19/2022)   DTaP/Tdap/Td (3 - Td or Tdap) 11/08/2027   PAP SMEAR-Modifier  01/03/2028   Hepatitis C Screening  Completed   HIV Screening  Completed   HPV VACCINES  Aged Out    Discussed health benefits of physical activity, and encouraged her to engage in regular exercise appropriate for her age and condition.  Problem List Items Addressed This Visit       Other   Abnormal TSH   Relevant Orders   TSH   Obesity (BMI 35.0-39.9 without comorbidity)    Discussed importance of healthy weight management Discussed diet and exercise        Relevant Orders   Comprehensive metabolic panel   Lipid panel   CBC   Other Visit Diagnoses     Encounter for annual physical exam    -  Primary   Relevant Orders   Iron, TIBC and Ferritin Panel   Comprehensive metabolic panel   Lipid panel   CBC   TSH   Iron deficiency       Relevant Orders   Iron, TIBC and Ferritin Panel   CBC   Colon cancer screening       Relevant Orders   Ambulatory referral to Gastroenterology        Return in about 1 year (around 01/28/2024) for CPE.     I, AnLavon PaganiniMD, have reviewed all documentation for this visit. The documentation on 01/28/23 for the exam, diagnosis, procedures, and orders are all accurate and complete.   Bacigalupo, AnDionne BucyMD, MPH [place]

## 2023-01-28 ENCOUNTER — Telehealth: Payer: Self-pay

## 2023-01-28 ENCOUNTER — Ambulatory Visit (INDEPENDENT_AMBULATORY_CARE_PROVIDER_SITE_OTHER): Payer: BC Managed Care – PPO | Admitting: Family Medicine

## 2023-01-28 ENCOUNTER — Encounter: Payer: Self-pay | Admitting: Family Medicine

## 2023-01-28 ENCOUNTER — Other Ambulatory Visit: Payer: Self-pay

## 2023-01-28 VITALS — BP 112/77 | HR 77 | Temp 98.0°F | Resp 16 | Ht 62.0 in | Wt 194.0 lb

## 2023-01-28 DIAGNOSIS — Z1211 Encounter for screening for malignant neoplasm of colon: Secondary | ICD-10-CM

## 2023-01-28 DIAGNOSIS — E611 Iron deficiency: Secondary | ICD-10-CM | POA: Diagnosis not present

## 2023-01-28 DIAGNOSIS — Z0000 Encounter for general adult medical examination without abnormal findings: Secondary | ICD-10-CM

## 2023-01-28 DIAGNOSIS — E669 Obesity, unspecified: Secondary | ICD-10-CM

## 2023-01-28 DIAGNOSIS — R7989 Other specified abnormal findings of blood chemistry: Secondary | ICD-10-CM

## 2023-01-28 MED ORDER — NA SULFATE-K SULFATE-MG SULF 17.5-3.13-1.6 GM/177ML PO SOLN: 1.0000 | Freq: Once | ORAL | 0 refills | Status: AC

## 2023-01-28 NOTE — Telephone Encounter (Signed)
Gastroenterology Pre-Procedure Review  Request Date: 07/08/23 Requesting Physician: Dr. Marius Ditch  PATIENT REVIEW QUESTIONS: The patient responded to the following health history questions as indicated:    1. Are you having any GI issues? no 2. Do you have a personal history of Polyps? no 3. Do you have a family history of Colon Cancer or Polyps? no 4. Diabetes Mellitus? no 5. Joint replacements in the past 12 months?no 6. Major health problems in the past 3 months?no 7. Any artificial heart valves, MVP, or defibrillator?no    MEDICATIONS & ALLERGIES:    Patient reports the following regarding taking any anticoagulation/antiplatelet therapy:   Plavix, Coumadin, Eliquis, Xarelto, Lovenox, Pradaxa, Brilinta, or Effient? no Aspirin? no  Patient confirms/reports the following medications:  Current Outpatient Medications  Medication Sig Dispense Refill   hydrOXYzine (ATARAX) 25 MG tablet Take 1 tablet (25 mg total) by mouth every 6 (six) hours as needed for anxiety. May take an additional tablet if needed 20 tablet 0   Multiple Vitamin (MULTIVITAMIN WITH MINERALS) TABS tablet Take 1 tablet by mouth 4 (four) times a week.     Probiotic Product (PROBIOTIC PO) Take 1 capsule by mouth daily. (Patient not taking: Reported on 01/28/2023)     No current facility-administered medications for this visit.    Patient confirms/reports the following allergies:  No Known Allergies  No orders of the defined types were placed in this encounter.   AUTHORIZATION INFORMATION Primary Insurance: 1D#: Group #:  Secondary Insurance: 1D#: Group #:  SCHEDULE INFORMATION: Date: 07/08/23 Time: Location: [facility]

## 2023-01-28 NOTE — Assessment & Plan Note (Signed)
Discussed importance of healthy weight management Discussed diet and exercise  

## 2023-01-29 LAB — IRON,TIBC AND FERRITIN PANEL
Ferritin: 19 ng/mL (ref 15–150)
Iron Saturation: 18 % (ref 15–55)
Iron: 67 ug/dL (ref 27–159)
Total Iron Binding Capacity: 368 ug/dL (ref 250–450)
UIBC: 301 ug/dL (ref 131–425)

## 2023-01-29 LAB — LIPID PANEL
Chol/HDL Ratio: 3.3 ratio (ref 0.0–4.4)
Cholesterol, Total: 143 mg/dL (ref 100–199)
HDL: 44 mg/dL (ref >39)
LDL Chol Calc (NIH): 82 mg/dL (ref 0–99)
Triglycerides: 91 mg/dL (ref 0–149)
VLDL Cholesterol Cal: 17 mg/dL (ref 5–40)

## 2023-01-29 LAB — TSH: TSH: 2.51 u[IU]/mL (ref 0.450–4.500)

## 2023-01-29 LAB — CBC
Hematocrit: 41.5 % (ref 34.0–46.6)
Hemoglobin: 12.5 g/dL (ref 11.1–15.9)
MCH: 24 pg — ABNORMAL LOW (ref 26.6–33.0)
MCHC: 30.1 g/dL — ABNORMAL LOW (ref 31.5–35.7)
MCV: 80 fL (ref 79–97)
Platelets: 365 10*3/uL (ref 150–450)
RBC: 5.21 x10E6/uL (ref 3.77–5.28)
RDW: 14.2 % (ref 11.7–15.4)
WBC: 6.2 10*3/uL (ref 3.4–10.8)

## 2023-01-29 LAB — COMPREHENSIVE METABOLIC PANEL
ALT: 15 IU/L (ref 0–32)
AST: 19 IU/L (ref 0–40)
Albumin/Globulin Ratio: 1.8 (ref 1.2–2.2)
Albumin: 4.6 g/dL (ref 3.9–4.9)
Alkaline Phosphatase: 76 IU/L (ref 44–121)
BUN/Creatinine Ratio: 14 (ref 9–23)
BUN: 11 mg/dL (ref 6–24)
Bilirubin Total: 0.7 mg/dL (ref 0.0–1.2)
CO2: 25 mmol/L (ref 20–29)
Calcium: 9.9 mg/dL (ref 8.7–10.2)
Chloride: 102 mmol/L (ref 96–106)
Creatinine, Ser: 0.8 mg/dL (ref 0.57–1.00)
Globulin, Total: 2.6 g/dL (ref 1.5–4.5)
Glucose: 91 mg/dL (ref 70–99)
Potassium: 4.5 mmol/L (ref 3.5–5.2)
Sodium: 140 mmol/L (ref 134–144)
Total Protein: 7.2 g/dL (ref 6.0–8.5)
eGFR: 93 mL/min/{1.73_m2} (ref >59)

## 2023-01-31 DIAGNOSIS — M9903 Segmental and somatic dysfunction of lumbar region: Secondary | ICD-10-CM | POA: Diagnosis not present

## 2023-01-31 DIAGNOSIS — M546 Pain in thoracic spine: Secondary | ICD-10-CM | POA: Diagnosis not present

## 2023-01-31 DIAGNOSIS — M5416 Radiculopathy, lumbar region: Secondary | ICD-10-CM | POA: Diagnosis not present

## 2023-01-31 DIAGNOSIS — M9902 Segmental and somatic dysfunction of thoracic region: Secondary | ICD-10-CM | POA: Diagnosis not present

## 2023-06-11 DIAGNOSIS — L570 Actinic keratosis: Secondary | ICD-10-CM | POA: Diagnosis not present

## 2023-06-11 DIAGNOSIS — Z86018 Personal history of other benign neoplasm: Secondary | ICD-10-CM | POA: Diagnosis not present

## 2023-06-11 DIAGNOSIS — L578 Other skin changes due to chronic exposure to nonionizing radiation: Secondary | ICD-10-CM | POA: Diagnosis not present

## 2023-06-11 DIAGNOSIS — Z872 Personal history of diseases of the skin and subcutaneous tissue: Secondary | ICD-10-CM | POA: Diagnosis not present

## 2023-06-20 DIAGNOSIS — R7309 Other abnormal glucose: Secondary | ICD-10-CM | POA: Diagnosis not present

## 2023-07-08 ENCOUNTER — Ambulatory Visit: Payer: BC Managed Care – PPO | Admitting: Certified Registered Nurse Anesthetist

## 2023-07-08 ENCOUNTER — Encounter
Admission: RE | Disposition: A | Discharge status: Home / Self Care | Payer: Self-pay | Source: Home / Self Care | Attending: Gastroenterology

## 2023-07-08 ENCOUNTER — Ambulatory Visit
Admission: RE | Admit: 2023-07-08 | Discharge: 2023-07-08 | Disposition: A | Discharge status: Home / Self Care | Payer: BC Managed Care – PPO | Source: Home / Self Care | Attending: Gastroenterology | Admitting: Gastroenterology

## 2023-07-08 ENCOUNTER — Encounter: Payer: Self-pay | Admitting: Gastroenterology

## 2023-07-08 DIAGNOSIS — Z1211 Encounter for screening for malignant neoplasm of colon: Secondary | ICD-10-CM | POA: Diagnosis not present

## 2023-07-08 HISTORY — PX: COLONOSCOPY WITH PROPOFOL: SHX5780

## 2023-07-08 LAB — POCT PREGNANCY, URINE: Preg Test, Ur: NEGATIVE

## 2023-07-08 SURGERY — COLONOSCOPY WITH PROPOFOL
Anesthesia: General

## 2023-07-08 MED ORDER — SODIUM CHLORIDE 0.9 % IV SOLN
INTRAVENOUS | Status: DC
  Administered 2023-07-08: 1000 mL via INTRAVENOUS

## 2023-07-08 MED ORDER — DEXMEDETOMIDINE HCL IN NACL 80 MCG/20ML IV SOLN
INTRAVENOUS | Status: DC | PRN
Start: 2023-07-08 — End: 2023-07-08
  Administered 2023-07-08 (×2): 4 ug via INTRAVENOUS

## 2023-07-08 MED ORDER — PROPOFOL 10 MG/ML IV BOLUS
INTRAVENOUS | Status: DC | PRN
  Administered 2023-07-08: 20 mg via INTRAVENOUS
  Administered 2023-07-08: 80 mg via INTRAVENOUS

## 2023-07-08 MED ORDER — PROPOFOL 500 MG/50ML IV EMUL
INTRAVENOUS | Status: DC | PRN
  Administered 2023-07-08: 150 ug/kg/min via INTRAVENOUS

## 2023-07-08 MED ORDER — LIDOCAINE HCL (CARDIAC) PF 100 MG/5ML IV SOSY
PREFILLED_SYRINGE | INTRAVENOUS | Status: DC | PRN
  Administered 2023-07-08: 50 mg via INTRAVENOUS

## 2023-07-08 MED ORDER — STERILE WATER FOR IRRIGATION IR SOLN
Status: DC | PRN
  Administered 2023-07-08: 180 mL

## 2023-07-08 NOTE — Transfer of Care (Signed)
Immediate Anesthesia Transfer of Care Note  Patient: Kayla West  Procedure(s) Performed: COLONOSCOPY WITH PROPOFOL  Patient Location: Endoscopy Unit  Anesthesia Type:General  Level of Consciousness: sedated and unresponsive  Airway & Oxygen Therapy: Patient Spontanous Breathing  Post-op Assessment: Report given to RN and Post -op Vital signs reviewed and stable  Post vital signs: Reviewed and stable  Last Vitals:  Vitals Value Taken Time  BP 102/56 07/08/23 0858  Temp 36.4 C 07/08/23 0857  Pulse 66 07/08/23 0859  Resp 17 07/08/23 0859  SpO2 100 % 07/08/23 0859  Vitals shown include unfiled device data.  Last Pain:  Vitals:   07/08/23 0857  TempSrc: Temporal  PainSc: Asleep         Complications: No notable events documented.

## 2023-07-08 NOTE — Op Note (Signed)
[facility] Gastroenterology Patient Name: Kayla West Procedure Date: 07/08/2023 8:14 AM MRN: [id number] Account #: [id number] Date of Birth: December 18, 1975 Admit Type: Outpatient Age: 47 Room: [facility] ENDO ROOM 4 Gender: Female Note Status: Finalized Instrument Name: Nelda Marseille [id number] Procedure:             Colonoscopy Indications:           Screening for colorectal malignant neoplasm, This is                         the patient's first colonoscopy Providers:             Toney Reil MD, MD Referring MD:          Marzella Schlein. Bacigalupo (Referring MD) Medicines:             General Anesthesia Complications:         No immediate complications. Estimated blood loss: None. Procedure:             Pre-Anesthesia Assessment:                        - Prior to the procedure, a History and Physical was                         performed, and patient medications and allergies were                         reviewed. The patient is competent. The risks and                         benefits of the procedure and the sedation options and                         risks were discussed with the patient. All questions                         were answered and informed consent was obtained.                         Patient identification and proposed procedure were                         verified by the physician, the nurse, the                         anesthesiologist, the anesthetist and the technician                         in the pre-procedure area in the procedure room in the                         endoscopy suite. Mental Status Examination: alert and                         oriented. Airway Examination: normal oropharyngeal                         airway and neck mobility. Respiratory Examination:  clear to auscultation. CV Examination: normal.                         Prophylactic Antibiotics: The patient does not require                          prophylactic antibiotics. Prior Anticoagulants: The                         patient has taken no anticoagulant or antiplatelet                         agents. ASA Grade Assessment: II - A patient with mild                         systemic disease. After reviewing the risks and                         benefits, the patient was deemed in satisfactory                         condition to undergo the procedure. The anesthesia                         plan was to use general anesthesia. Immediately prior                         to administration of medications, the patient was                         re-assessed for adequacy to receive sedatives. The                         heart rate, respiratory rate, oxygen saturations,                         blood pressure, adequacy of pulmonary ventilation, and                         response to care were monitored throughout the                         procedure. The physical status of the patient was                         re-assessed after the procedure.                        After obtaining informed consent, the colonoscope was                         passed under direct vision. Throughout the procedure,                         the patient's blood pressure, pulse, and oxygen                         saturations were monitored continuously. The  Colonoscope was introduced through the anus and                         advanced to the the cecum, identified by appendiceal                         orifice and ileocecal valve. The colonoscopy was                         performed without difficulty. The patient tolerated                         the procedure well. The quality of the bowel                         preparation was evaluated using the BBPS [facility] Bowel                         Preparation Scale) with scores of: Right Colon = 3,                         Transverse Colon = 3 and Left Colon = 3 (entire mucosa                          seen well with no residual staining, small fragments                         of stool or opaque liquid). The total BBPS score                         equals 9. The ileocecal valve, appendiceal orifice,                         and rectum were photographed. Findings:      The perianal and digital rectal examinations were normal. Pertinent       negatives include normal sphincter tone and no palpable rectal lesions.      The entire examined colon appeared normal.      The retroflexed view of the distal rectum and anal verge was normal and       showed no anal or rectal abnormalities. Impression:            - The entire examined colon is normal.                        - The distal rectum and anal verge are normal on                         retroflexion view.                        - No specimens collected. Recommendation:        - Discharge patient to home (with escort).                        - Resume previous diet today.                        -  Continue present medications.                        - Repeat colonoscopy in 10 years for screening                         purposes. Procedure Code(s):     --- Professional ---                        Z6109, Colorectal cancer screening; colonoscopy on                         individual not meeting criteria for high risk Diagnosis Code(s):     --- Professional ---                        Z12.11, Encounter for screening for malignant neoplasm                         of colon CPT copyright 2022 American Medical Association. All rights reserved. The codes documented in this report are preliminary and upon coder review may  be revised to meet current compliance requirements. Dr. Libby Maw Toney Reil MD, MD 07/08/2023 8:56:09 AM This report has been signed electronically. Number of Addenda: 0 Note Initiated On: 07/08/2023 8:14 AM Scope Withdrawal Time: 0 hours 9 minutes 10 seconds  Total Procedure Duration: 0 hours 16 minutes 55  seconds  Estimated Blood Loss:  Estimated blood loss: none.      [facility]

## 2023-07-08 NOTE — Anesthesia Preprocedure Evaluation (Signed)
Anesthesia Evaluation  Patient identified by MRN, date of birth, ID band Patient awake    Reviewed: Allergy & Precautions, H&P , NPO status , Patient's Chart, lab work & pertinent test results  History of Anesthesia Complications (+) PONV and history of anesthetic complications  Airway Mallampati: II  TM Distance: >3 FB Neck ROM: full    Dental  (+) Dental Advidsory Given, Caps, Missing   Pulmonary neg pulmonary ROS, neg shortness of breath          Cardiovascular Exercise Tolerance: Good (-) angina (-) Past MI and (-) DOE negative cardio ROS      Neuro/Psych negative neurological ROS  negative psych ROS   GI/Hepatic negative GI ROS, Neg liver ROS,,,  Endo/Other  negative endocrine ROS    Renal/GU      Musculoskeletal   Abdominal   Peds  Hematology negative hematology ROS (+)   Anesthesia Other Findings Past Medical History: No date: GERD (gastroesophageal reflux disease) No date: No pertinent past medical history  Past Surgical History: No date: BREAST LUMPECTOMY; Left     Comment:  benign indication No date: CESAREAN SECTION     Comment:  2019, 2015     Reproductive/Obstetrics negative OB ROS                             Anesthesia Physical Anesthesia Plan  ASA: II  Anesthesia Plan: General LMA   Post-op Pain Management:    Induction: Intravenous  PONV Risk Score and Plan: 4 or greater and Treatment may vary due to age or medical condition, Propofol infusion and TIVA  Airway Management Planned: Natural Airway and Nasal Cannula  Additional Equipment:   Intra-op Plan:   Post-operative Plan:   Informed Consent: I have reviewed the patients History and Physical, chart, labs and discussed the procedure including the risks, benefits and alternatives for the proposed anesthesia with the patient or authorized representative who has indicated his/her understanding and  acceptance.     Dental Advisory Given  Plan Discussed with: Anesthesiologist, CRNA and Surgeon  Anesthesia Plan Comments:         Anesthesia Quick Evaluation

## 2023-07-08 NOTE — Anesthesia Procedure Notes (Signed)
Date/Time: 07/08/2023 8:32 AM  Performed by: Ginger Carne, CRNAPre-anesthesia Checklist: Patient identified, Emergency Drugs available, Suction available, Patient being monitored and Timeout performed Patient Re-evaluated:Patient Re-evaluated prior to induction Oxygen Delivery Method: Nasal cannula Preoxygenation: Pre-oxygenation with 100% oxygen Induction Type: IV induction

## 2023-07-08 NOTE — H&P (Signed)
Arlyss Repress, MD [address]  [address]  [place], [place] [address]  Main: [phone]  Fax: [phone] Pager: [phone]  Primary Care Physician:  Erasmo Downer, MD Primary Gastroenterologist:  Dr. Arlyss Repress  Pre-Procedure History & Physical: HPI:  Kayla West is a 47 y.o. female is here for a colonoscopy.   Past Medical History:  Diagnosis Date   Abnormal TSH 06/07/2017   3.8 on NOB labs and started on Synthroid. Recheck late August.   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   Endometrial polyp 12/08/2019   Family history of breast cancer    Family history of pancreatic cancer    History of pre-eclampsia 07/01/2017   Increased risk of breast cancer 04/2020   IBIS=24.9%/riskscore=16.5%   PONV (postoperative nausea and vomiting)    Positive ANA (antinuclear antibody) 02/22/2014   Sees Rheumatology, no meds   Symptomatic cholelithiasis     Past Surgical History:  Procedure Laterality Date   BREAST BIOPSY Right 06/21/2020   benign MR biopsy   BREAST LUMPECTOMY Left 2010   benign indication   CESAREAN SECTION     2019, 2015   CHOLECYSTECTOMY  10/20/2020   gallstones   DENTAL SURGERY     DILATATION & CURETTAGE/HYSTEROSCOPY WITH MYOSURE N/A 12/08/2019   Procedure: DILATATION & CURETTAGE/HYSTEROSCOPY WITH MYOSURE POLYPECOMTY;  Surgeon: Conard Novak, MD;  Location: [facility];  Service: Gynecology;  Laterality: N/A;   DILATION AND CURETTAGE OF UTERUS      Prior to Admission medications   Medication Sig Start Date End Date Taking? Authorizing Provider  hydrOXYzine (ATARAX) 25 MG tablet Take 1 tablet (25 mg total) by mouth every 6 (six) hours as needed for anxiety. May take an additional tablet if needed 07/09/22   Domenick Gong, MD  Multiple Vitamin (MULTIVITAMIN WITH MINERALS) TABS tablet Take 1 tablet by mouth 4 (four) times a week.    [provider]  Probiotic Product (PROBIOTIC PO) Take  1 capsule by mouth daily. Patient not taking: Reported on 01/28/2023    [provider]    Allergies as of 01/28/2023   (No Known Allergies)    Family History  Problem Relation Age of Onset   Pancreatic cancer Mother 74   Breast cancer Maternal Aunt        twice-50s and 60s   Breast cancer Maternal Grandmother        twice- 8s and 70s   Breast cancer Maternal Aunt 54   Diabetes Paternal Grandmother    Stroke Paternal Grandmother    Colon cancer Neg Hx     Social History   Socioeconomic History   Marital status: Married    Spouse name: james   Number of children: 3   Years of education: Not on file   Highest education level: Not on file  Occupational History   Occupation: professor  Tobacco Use   Smoking status: Never   Smokeless tobacco: Never  Vaping Use   Vaping status: Never Used  Substance and Sexual Activity   Alcohol use: Not Currently   Drug use: Never   Sexual activity: Yes    Partners: Male    Birth control/protection: None    Comment: Vasectomy  Other Topics Concern   Not on file  Social History Narrative   Patient lives with husband and 3 children.  Feels safe in her home   Social Determinants of Health   Financial Resource Strain: Not  on file  Food Insecurity: Not on file  Transportation Needs: Not on file  Physical Activity: Not on file  Stress: Not on file  Social Connections: Not on file  Intimate Partner Violence: Not on file    Review of Systems: See HPI, otherwise negative ROS  Physical Exam: BP (!) 129/94   Pulse 90   Temp (!) 96.9 F (36.1 C) (Temporal)   Resp 18   Ht 5\' 2"  (1.575 m)   Wt 86.9 kg   LMP 07/01/2023 Comment: Pregnancy test negative  SpO2 100%   BMI 35.04 kg/m  General:   Alert,  pleasant and cooperative in NAD Head:  Normocephalic and atraumatic. Neck:  Supple; no masses or thyromegaly. Lungs:  Clear throughout to auscultation.    Heart:  Regular rate and rhythm. Abdomen:  Soft, nontender and  nondistended. Normal bowel sounds, without guarding, and without rebound.   Neurologic:  Alert and  oriented x4;  grossly normal neurologically.  Impression/Plan: Kayla West is here for a colonoscopy to be performed for colon cancer screening  Risks, benefits, limitations, and alternatives regarding  colonoscopy have been reviewed with the patient.  Questions have been answered.  All parties agreeable.   Lannette Donath, MD  07/08/2023, 8:15 AM

## 2023-07-09 ENCOUNTER — Encounter: Payer: Self-pay | Admitting: Gastroenterology

## 2023-07-17 NOTE — Anesthesia Postprocedure Evaluation (Signed)
Anesthesia Post Note  Patient: Kayla West  Procedure(s) Performed: COLONOSCOPY WITH PROPOFOL  Patient location during evaluation: Endoscopy Anesthesia Type: General Level of consciousness: awake and alert Pain management: pain level controlled Vital Signs Assessment: post-procedure vital signs reviewed and stable Respiratory status: spontaneous breathing, nonlabored ventilation, respiratory function stable and patient connected to nasal cannula oxygen Cardiovascular status: blood pressure returned to baseline and stable Postop Assessment: no apparent nausea or vomiting Anesthetic complications: no   No notable events documented.   Last Vitals:  Vitals:   07/08/23 0857 07/08/23 0917  BP: (!) 102/56 123/75  Pulse: 66   Resp: 17   Temp: (!) 36.4 C   SpO2: 100%     Last Pain:  Vitals:   07/09/23 0744  TempSrc:   PainSc: 0-No pain                 Lenard Simmer

## 2023-07-21 DIAGNOSIS — R7309 Other abnormal glucose: Secondary | ICD-10-CM | POA: Diagnosis not present

## 2023-08-19 ENCOUNTER — Other Ambulatory Visit: Payer: Self-pay | Admitting: Obstetrics and Gynecology

## 2023-08-19 DIAGNOSIS — Z9189 Other specified personal risk factors, not elsewhere classified: Secondary | ICD-10-CM

## 2023-08-20 DIAGNOSIS — R7309 Other abnormal glucose: Secondary | ICD-10-CM | POA: Diagnosis not present

## 2023-09-20 ENCOUNTER — Other Ambulatory Visit: Payer: Self-pay | Admitting: Obstetrics and Gynecology

## 2023-09-20 DIAGNOSIS — R7309 Other abnormal glucose: Secondary | ICD-10-CM | POA: Diagnosis not present

## 2023-09-25 ENCOUNTER — Ambulatory Visit
Admission: RE | Admit: 2023-09-25 | Discharge: 2023-09-25 | Disposition: A | Discharge status: Home / Self Care | Payer: BC Managed Care – PPO | Source: Ambulatory Visit | Attending: Obstetrics and Gynecology | Admitting: Obstetrics and Gynecology

## 2023-09-25 ENCOUNTER — Ambulatory Visit
Admission: EM | Admit: 2023-09-25 | Discharge: 2023-09-25 | Disposition: A | Discharge status: Home / Self Care | Payer: BC Managed Care – PPO | Attending: Emergency Medicine | Admitting: Emergency Medicine

## 2023-09-25 DIAGNOSIS — Z1239 Encounter for other screening for malignant neoplasm of breast: Secondary | ICD-10-CM | POA: Diagnosis not present

## 2023-09-25 DIAGNOSIS — Z9189 Other specified personal risk factors, not elsewhere classified: Secondary | ICD-10-CM

## 2023-09-25 DIAGNOSIS — J209 Acute bronchitis, unspecified: Secondary | ICD-10-CM | POA: Diagnosis not present

## 2023-09-25 DIAGNOSIS — R051 Acute cough: Secondary | ICD-10-CM

## 2023-09-25 DIAGNOSIS — Z803 Family history of malignant neoplasm of breast: Secondary | ICD-10-CM | POA: Diagnosis not present

## 2023-09-25 MED ORDER — GADOPICLENOL 0.5 MMOL/ML IV SOLN
8.0000 mL | Freq: Once | INTRAVENOUS | Status: AC | PRN
  Administered 2023-09-25: 8 mL via INTRAVENOUS

## 2023-09-25 MED ORDER — ALBUTEROL SULFATE HFA 108 (90 BASE) MCG/ACT IN AERS: 1.0000 | INHALATION_SPRAY | Freq: Four times a day (QID) | RESPIRATORY_TRACT | 0 refills | Status: DC | PRN

## 2023-09-25 MED ORDER — PREDNISONE 10 MG (21) PO TBPK: ORAL_TABLET | Freq: Every day | ORAL | 0 refills | Status: DC

## 2023-09-25 NOTE — Discharge Instructions (Addendum)
Use the albuterol inhaler and take the prednisone as directed.  Follow up with your primary care provider.   ° °

## 2023-09-25 NOTE — ED Triage Notes (Signed)
Patient to Urgent Care with complaints of persistent/ lingering cough x2 weeks. Reports cough is dry and she is unable to produce anything. Poor sleep. Soreness through her ribs and neck.   Reports symptoms stared with a URI. Possible fever at the beginning of illness.   Has been taking Mucinex.

## 2023-09-25 NOTE — ED Provider Notes (Signed)
Kayla West    CSN: [id number] Arrival date & time: 09/25/23  1705      History   Chief Complaint Chief Complaint  Patient presents with   Cough    HPI Kayla West is a 47 y.o. female.   Patient presents with 2-week history of nonproductive cough.  At the onset of her symptoms, she had subjective fever and sore throat but these have resolved.  Her cough is worse at night.  She has been taking Mucinex.  No chest pain, shortness of breath, or other symptoms.   The history is provided by the patient and medical records.    Past Medical History:  Diagnosis Date   Abnormal TSH 06/07/2017   3.8 on NOB labs and started on Synthroid. Recheck late August.   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   Endometrial polyp 12/08/2019   Family history of breast cancer    Family history of pancreatic cancer    History of pre-eclampsia 07/01/2017   Increased risk of breast cancer 04/2020   IBIS=24.9%/riskscore=16.5%   PONV (postoperative nausea and vomiting)    Positive ANA (antinuclear antibody) 02/22/2014   Sees Rheumatology, no meds   Symptomatic cholelithiasis     Patient Active Problem List   Diagnosis Date Noted   Encounter for screening colonoscopy 07/08/2023   Family history of breast cancer 12/05/2021   Breast cancer screening, high risk patient 12/05/2021   Obesity (BMI 35.0-39.9 without comorbidity) 01/19/2021   Abnormal TSH 06/07/2017    Past Surgical History:  Procedure Laterality Date   BREAST BIOPSY Right 06/21/2020   benign MR biopsy   BREAST LUMPECTOMY Left 2010   benign indication   CESAREAN SECTION     2019, 2015   CHOLECYSTECTOMY  10/20/2020   gallstones   COLONOSCOPY WITH PROPOFOL N/A 07/08/2023   Procedure: COLONOSCOPY WITH PROPOFOL;  Surgeon: Toney Reil, MD;  Location: [facility];  Service: Gastroenterology;  Laterality: N/A;   DENTAL SURGERY     DILATATION & CURETTAGE/HYSTEROSCOPY WITH  MYOSURE N/A 12/08/2019   Procedure: DILATATION & CURETTAGE/HYSTEROSCOPY WITH MYOSURE POLYPECOMTY;  Surgeon: Conard Novak, MD;  Location: [facility];  Service: Gynecology;  Laterality: N/A;   DILATION AND CURETTAGE OF UTERUS      OB History     Gravida  3   Para  2   Term  2   Preterm      AB  1   Living  3      SAB  1   IAB      Ectopic      Multiple  1   Live Births  3            Home Medications    Prior to Admission medications   Medication Sig Start Date End Date Taking? Authorizing Provider  albuterol (VENTOLIN HFA) 108 (90 Base) MCG/ACT inhaler Inhale 1-2 puffs into the lungs every 6 (six) hours as needed. 09/25/23  Yes Mickie Bail, NP  predniSONE (STERAPRED UNI-PAK 21 TAB) 10 MG (21) TBPK tablet Take by mouth daily. As directed 09/25/23  Yes Mickie Bail, NP  Multiple Vitamin (MULTIVITAMIN WITH MINERALS) TABS tablet Take 1 tablet by mouth 4 (four) times a week.    [provider]    Family History Family History  Problem Relation Age of Onset   Pancreatic cancer Mother 61   Breast cancer Maternal Aunt  twice-50s and 60s   Breast cancer Maternal Grandmother        twice- 60s and 70s   Breast cancer Maternal Aunt 77   Diabetes Paternal Grandmother    Stroke Paternal Grandmother    Colon cancer Neg Hx     Social History Social History   Tobacco Use   Smoking status: Never   Smokeless tobacco: Never  Vaping Use   Vaping status: Never Used  Substance Use Topics   Alcohol use: Not Currently   Drug use: Never     Allergies   Patient has no known allergies.   Review of Systems Review of Systems  Constitutional:  Negative for chills and fever.  HENT:  Negative for ear pain and sore throat.   Respiratory:  Positive for cough. Negative for shortness of breath.   Cardiovascular:  Negative for chest pain and palpitations.     Physical Exam Triage Vital Signs ED Triage Vitals  Encounter Vitals Group     BP  09/25/23 1712 128/85     Systolic BP Percentile --      Diastolic BP Percentile --      Pulse Rate 09/25/23 1712 84     Resp 09/25/23 1712 16     Temp 09/25/23 1712 98 F (36.7 C)     Temp src --      SpO2 09/25/23 1712 100 %     Weight --      Height --      Head Circumference --      Peak Flow --      Pain Score 09/25/23 1711 0     Pain Loc --      Pain Education --      Exclude from Growth Chart --    No data found.  Updated Vital Signs BP 128/85   Pulse 84   Temp 98 F (36.7 C)   Resp 16   LMP 09/18/2023   SpO2 100%   Visual Acuity Right Eye Distance:   Left Eye Distance:   Bilateral Distance:    Right Eye Near:   Left Eye Near:    Bilateral Near:     Physical Exam Vitals and nursing note reviewed.  Constitutional:      General: She is not in acute distress.    Appearance: She is well-developed.  HENT:     Right Ear: Tympanic membrane normal.     Left Ear: Tympanic membrane normal.     Nose: Nose normal.     Mouth/Throat:     Mouth: Mucous membranes are moist.     Pharynx: Oropharynx is clear.  Cardiovascular:     Rate and Rhythm: Normal rate and regular rhythm.     Heart sounds: Normal heart sounds.  Pulmonary:     Effort: Pulmonary effort is normal. No respiratory distress.     Breath sounds: Normal breath sounds.     Comments: Frequent nonproductive cough Musculoskeletal:     Cervical back: Neck supple.  Skin:    General: Skin is warm and dry.  Neurological:     Mental Status: She is alert.      UC Treatments / Results  Labs (all labs ordered are listed, but only abnormal results are displayed) Labs Reviewed - No data to display  EKG   Radiology MR BREAST BILATERAL W WO CONTRAST INC CAD  Result Date: 09/25/2023 CLINICAL DATA:  High risk supplemental screening. Family history of breast carcinoma, grandmother in her 35s and 2  maternal aunts in their 35s or 38s. Two previous left breast benign biopsies in 2021. EXAM: BILATERAL BREAST  MRI WITH AND WITHOUT CONTRAST TECHNIQUE: Multiplanar, multisequence MR images of both breasts were obtained prior to and following the intravenous administration of 8 ml of Vueway. Three-dimensional MR images were rendered by post-processing of the original MR data on an independent workstation. The three-dimensional MR images were interpreted, and findings are reported in the following complete MRI report for this study. Three dimensional images were evaluated at the independent interpreting workstation using the DynaCAD thin client. COMPARISON:  Prior exams including previous breast MRIs, most recent dated 09/09/2022. FINDINGS: Breast composition: b. Scattered fibroglandular tissue. Background parenchymal enhancement: Mild Right breast: No mass or abnormal enhancement. Left breast: No mass or abnormal enhancement. Lymph nodes: No abnormal appearing lymph nodes. Ancillary findings:  None. IMPRESSION: No MRI evidence of breast malignancy. RECOMMENDATION: 1. Annual screening mammography. Last study performed on 01/14/2023. 2. Supple high risk screening breast MRI in 1 year. BI-RADS CATEGORY  1: Negative. Electronically Signed   By: Amie Portland M.D.   On: 09/25/2023 15:32    Procedures Procedures (including critical care time)  Medications Ordered in UC Medications - No data to display  Initial Impression / Assessment and Plan / UC Course  I have reviewed the triage vital signs and the nursing notes.  Pertinent labs & imaging results that were available during my care of the patient were reviewed by me and considered in my medical decision making (see chart for details).    Cough, acute bronchitis.  Afebrile and vital signs are stable.  Lungs are clear and O2 sat is 100% on room air.  Treating with albuterol inhaler and prednisone taper.  Education provided on acute bronchitis.  Instructed patient to follow-up with her PCP if she is not improving.  She agrees to plan of care.  Final Clinical  Impressions(s) / UC Diagnoses   Final diagnoses:  Acute cough  Acute bronchitis, unspecified organism     Discharge Instructions      Use the albuterol inhaler and take the prednisone as directed.  Follow-up with your primary care provider.     ED Prescriptions     Medication Sig Dispense Auth. Provider   albuterol (VENTOLIN HFA) 108 (90 Base) MCG/ACT inhaler Inhale 1-2 puffs into the lungs every 6 (six) hours as needed. 18 g Mickie Bail, NP   predniSONE (STERAPRED UNI-PAK 21 TAB) 10 MG (21) TBPK tablet Take by mouth daily. As directed 21 tablet Mickie Bail, NP      PDMP not reviewed this encounter.   Mickie Bail, NP 09/25/23 [phone]

## 2023-10-20 DIAGNOSIS — R7309 Other abnormal glucose: Secondary | ICD-10-CM | POA: Diagnosis not present

## 2023-10-21 ENCOUNTER — Ambulatory Visit: Payer: Self-pay | Admitting: *Deleted

## 2023-10-21 NOTE — Telephone Encounter (Signed)
Noted  

## 2023-10-21 NOTE — Telephone Encounter (Signed)
  Chief Complaint: vomiting , fever 102.3  Symptoms: started this am approx 2 am . Vomited 4- 6 times yellow colored emesis. No vomiting since. Nausea, fever 102.3 prior to call , took acetaminophen will recheck in 30 minutes. Tolerating little fluids has not eaten any food today been in bed all day . Son was vomiting on Saturday but better now  Frequency: this am 2 am  Pertinent Negatives: Patient denies dizziness no weakness standing reported. No vomiting since this am  Disposition: [] ED /[] Urgent Care (no appt availability in office) / [] Appointment(In office/virtual)/ []  [facility]/ [] Home Care/ [x] Refused Recommended Disposition /[] [facility]/ []  Follow-up with PCP Additional Notes:   Offered appt tomorrow. Declined will recheck temp in 30 minutes and if continued 101 will do UC VV or go to UC or call back.     Reason for Disposition  [1] MILD or MODERATE vomiting AND [2] present > 48 hours (2 days) (Exception: Mild vomiting with associated diarrhea.)  Answer Assessment - Initial Assessment Questions 1. VOMITING SEVERITY: "How many times have you vomited in the past 24 hours?"     - MILD:  1 - 2 times/day    - MODERATE: 3 - 5 times/day, decreased oral intake without significant weight loss or symptoms of dehydration    - SEVERE: 6 or more times/day, vomits everything or nearly everything, with significant weight loss, symptoms of dehydration      severe 2. ONSET: "When did the vomiting begin?"      2 am this morning  3. FLUIDS: "What fluids or food have you vomited up today?" "Have you been able to keep any fluids down?"     Food and water 4. ABDOMEN PAIN: "Are your having any abdomen pain?" If Yes : "How bad is it and what does it feel like?" (e.g., crampy, dull, intermittent, constant)      Nausea  5. DIARRHEA: "Is there any diarrhea?" If Yes, ask: "How many times today?"      No  6. CONTACTS: "Is there anyone else in the family with the same symptoms?"       Yes son was vomiting Saturday but better now  7. CAUSE: "What do you think is causing your vomiting?"     Not sure  8. HYDRATION STATUS: "Any signs of dehydration?" (e.g., dry mouth [not only dry lips], too weak to stand) "When did you last urinate?"     Can urinate 9. OTHER SYMPTOMS: "Do you have any other symptoms?" (e.g., fever, headache, vertigo, vomiting blood or coffee grounds, recent head injury)     Chills ,  body aches. Fever 102.3 nausea, no vomiting now. Was vomiting this am 4- 6 times at 2 am but none since. "Looks yellow like bile"  10. PREGNANCY: "Is there any chance you are pregnant?" "When was your last menstrual period?"       na  Protocols used: Vomiting-A-AH

## 2023-11-20 DIAGNOSIS — R7309 Other abnormal glucose: Secondary | ICD-10-CM | POA: Diagnosis not present

## 2023-12-16 DIAGNOSIS — Z872 Personal history of diseases of the skin and subcutaneous tissue: Secondary | ICD-10-CM | POA: Diagnosis not present

## 2023-12-16 DIAGNOSIS — L578 Other skin changes due to chronic exposure to nonionizing radiation: Secondary | ICD-10-CM | POA: Diagnosis not present

## 2023-12-16 DIAGNOSIS — Z86018 Personal history of other benign neoplasm: Secondary | ICD-10-CM | POA: Diagnosis not present

## 2023-12-21 DIAGNOSIS — R7309 Other abnormal glucose: Secondary | ICD-10-CM | POA: Diagnosis not present

## 2024-01-18 DIAGNOSIS — R7309 Other abnormal glucose: Secondary | ICD-10-CM | POA: Diagnosis not present

## 2024-02-03 ENCOUNTER — Encounter: Payer: BC Managed Care – PPO | Admitting: Family Medicine

## 2024-02-14 ENCOUNTER — Encounter: Payer: Self-pay | Admitting: Family Medicine

## 2024-02-18 DIAGNOSIS — R7309 Other abnormal glucose: Secondary | ICD-10-CM | POA: Diagnosis not present

## 2024-02-20 ENCOUNTER — Encounter: Payer: Self-pay | Admitting: Family Medicine

## 2024-02-20 ENCOUNTER — Ambulatory Visit (INDEPENDENT_AMBULATORY_CARE_PROVIDER_SITE_OTHER): Admitting: Family Medicine

## 2024-02-20 VITALS — BP 127/77 | HR 74 | Ht 62.0 in | Wt 204.0 lb

## 2024-02-20 DIAGNOSIS — Z0000 Encounter for general adult medical examination without abnormal findings: Secondary | ICD-10-CM

## 2024-02-20 DIAGNOSIS — R7989 Other specified abnormal findings of blood chemistry: Secondary | ICD-10-CM

## 2024-02-20 DIAGNOSIS — E669 Obesity, unspecified: Secondary | ICD-10-CM | POA: Diagnosis not present

## 2024-02-20 DIAGNOSIS — Z1231 Encounter for screening mammogram for malignant neoplasm of breast: Secondary | ICD-10-CM | POA: Diagnosis not present

## 2024-02-20 NOTE — Progress Notes (Signed)
 Complete physical exam   Patient: Kayla West   DOB: 08-Mar-1976   48 y.o. Female  MRN: [id number] Visit Date: 02/20/2024  Today's healthcare provider: Shirlee Latch, MD   Chief Complaint  Patient presents with   Annual Exam    Diet -  Well balanced Exercise - walking 3 to 4 days a week for one hour Feeling - well Sleeping - fairly well due to broken sleep over the last 6 months Concerns - lose weight    Subjective    Kayla West is a 48 y.o. female who presents today for a complete physical exam.    Discussed the use of AI scribe software for clinical note transcription with the patient, who gave verbal consent to proceed.  History of Present Illness   The patient, with a history of hypertension, presents for a routine physical examination. Her main concern is weight loss, which she has been struggling with. She has been trying to incorporate more physical activity into her daily routine, aiming for at least an hour of brisk walking a day and trying to reach a goal of 10,000 steps. She has noticed some weight loss on her scale, but also acknowledges that she has gained weight since her last visit.  In addition to her weight concerns, the patient has noticed changes in her skin and sex drive. She describes her skin as being dry and itchy, despite using high-quality moisturizers. She also reports a significant decrease in her sex drive and increased vaginal dryness, which she believes may be related to perimenopause. However, she notes that her menstrual cycle remains regular.  The patient is proactive about her health and has been keeping up with regular screenings. She recently had a colonoscopy, which showed no polyps, and is due for a mammogram. She expresses a commitment to preventative health measures and is seeking guidance on dietary changes to aid in her weight loss efforts.        Last depression screening scores    02/20/2024    3:39 PM 01/28/2023    8:40  AM 01/22/2022   10:07 AM  PHQ 2/9 Scores  PHQ - 2 Score 0 0 0  PHQ- 9 Score 2 1 1    Last fall risk screening    02/20/2024    3:39 PM  Fall Risk   Falls in the past year? 0  Number falls in past yr: 0  Injury with Fall? 0  Risk for fall due to : No Fall Risks  Follow up Falls evaluation completed        Medications: Outpatient Medications Prior to Visit  Medication Sig   Multiple Vitamin (MULTIVITAMIN WITH MINERALS) TABS tablet Take 1 tablet by mouth 4 (four) times a week.   [DISCONTINUED] albuterol (VENTOLIN HFA) 108 (90 Base) MCG/ACT inhaler Inhale 1-2 puffs into the lungs every 6 (six) hours as needed.   [DISCONTINUED] predniSONE (STERAPRED UNI-PAK 21 TAB) 10 MG (21) TBPK tablet Take by mouth daily. As directed   No facility-administered medications prior to visit.    Review of Systems      Objective    BP 127/77 (BP Location: Left Arm, Patient Position: Sitting, Cuff Size: Normal)   Pulse 74   Ht 5\' 2"  (1.575 m)   Wt 204 lb (92.5 kg)   LMP 02/01/2024   BMI 37.31 kg/m    Physical Exam Vitals reviewed.  Constitutional:      General: She is not in acute distress.  Appearance: Normal appearance. She is well-developed. She is not diaphoretic.  HENT:     Head: Normocephalic and atraumatic.     Right Ear: Tympanic membrane, ear canal and external ear normal.     Left Ear: Tympanic membrane, ear canal and external ear normal.     Nose: Nose normal.     Mouth/Throat:     Mouth: Mucous membranes are moist.     Pharynx: Oropharynx is clear. No oropharyngeal exudate.  Eyes:     General: No scleral icterus.    Conjunctiva/sclera: Conjunctivae normal.     Pupils: Pupils are equal, round, and reactive to light.  Neck:     Thyroid: No thyromegaly.  Cardiovascular:     Rate and Rhythm: Normal rate and regular rhythm.     Heart sounds: Normal heart sounds. No murmur heard. Pulmonary:     Effort: Pulmonary effort is normal. No respiratory distress.     Breath  sounds: Normal breath sounds. No wheezing or rales.  Abdominal:     General: There is no distension.     Palpations: Abdomen is soft.     Tenderness: There is no abdominal tenderness.  Musculoskeletal:        General: No deformity.     Cervical back: Neck supple.     Right lower leg: No edema.     Left lower leg: No edema.  Lymphadenopathy:     Cervical: No cervical adenopathy.  Skin:    General: Skin is warm and dry.     Findings: No rash.  Neurological:     Mental Status: She is alert and oriented to person, place, and time. Mental status is at baseline.     Gait: Gait normal.  Psychiatric:        Mood and Affect: Mood normal.        Behavior: Behavior normal.        Thought Content: Thought content normal.      No results found for any visits on 02/20/24.  Assessment & Plan    Routine Health Maintenance and Physical Exam  Exercise Activities and Dietary recommendations  Goals   None     Immunization History  Administered Date(s) Administered   Influenza Inj Mdck Quad Pf 09/27/2014, 01/17/2016, 10/08/2017   Influenza,inj,Quad PF,6+ Mos 09/01/2019, 09/02/2020   Influenza-Unspecified 09/27/2014, 01/17/2016, 10/08/2017   PFIZER(Purple Top)SARS-COV-2 Vaccination 01/21/2020, 02/11/2020   Pfizer Fall 2024 Covid-19 Vaccine 69yrs thru 35yrs. 10/14/2020   Tdap 08/16/2014, 11/07/2017    Health Maintenance  Topic Date Due   COVID-19 Vaccine (3 - 2024-25 season) 07/21/2023   INFLUENZA VACCINE  06/19/2024   DTaP/Tdap/Td (3 - Td or Tdap) 11/08/2027   Cervical Cancer Screening (HPV/Pap Cotest)  01/03/2028   Colonoscopy  07/07/2033   Hepatitis C Screening  Completed   HIV Screening  Completed   HPV VACCINES  Aged Out    Discussed health benefits of physical activity, and encouraged her to engage in regular exercise appropriate for her age and condition.  Problem List Items Addressed This Visit       Other   Abnormal TSH   Relevant Orders   TSH   Obesity (BMI  35.0-39.9 without comorbidity)   Relevant Orders   Comprehensive metabolic panel with GFR   Lipid Panel With LDL/HDL Ratio   Other Visit Diagnoses       Encounter for annual physical exam    -  Primary   Relevant Orders   Comprehensive metabolic panel with GFR  Lipid Panel With LDL/HDL Ratio   TSH     Breast cancer screening by mammogram       Relevant Orders   MM 3D SCREENING MAMMOGRAM BILATERAL BREAST         Obesity   She is struggling with weight loss, currently weighing 204 lbs, up from 194 lbs last year. She is actively trying to lose weight by walking and aiming for 10,000 steps a day. She has also started making dietary changes, such as making her own bread and increasing protein intake. Discussed the importance of weightlifting to improve metabolism and advised on portion control, especially with carbohydrates, as she approaches perimenopause. Emphasized the benefits of a plant-forward diet for weight loss, heart health, and diabetes prevention. Highlighted the importance of fiber intake and the role of lean proteins and fibrous vegetables in feeling full and supporting weight loss.   - Incorporate weightlifting into exercise routine to improve metabolism.   - Focus on portion control, especially with carbohydrates.   - Increase intake of lean proteins and fibrous vegetables.    Perimenopausal Symptoms   She reports symptoms that may be related to perimenopause, including decreased libido and vaginal dryness, despite having regular menstrual cycles. Discussed the possibility of perimenopause and the importance of monitoring symptoms. Advised on natural options for managing symptoms and the need to consult with her gynecologist if symptoms persist or worsen.   - Monitor symptoms of perimenopause and discuss with gynecologist if symptoms persist or worsen.    General Health Maintenance   She is up to date with her colonoscopy and Pap smear. She is due for a mammogram, which was  last done in February of last year. Emphasized the importance of continuing with regular screenings and preventative care, including flu shots in the fall.   - Order mammogram and instruct her to schedule it.   - Remind her about the importance of flu shots in the fall.    Follow-up   She plans to return for blood work next week to check cholesterol, kidney and liver function, thyroid, and glucose levels. Will review the results and communicate them to her. Encouraged scheduling next year's physical exam.   - Return for blood work next week.   - Review lab results and communicate findings to her.   - Schedule next year's physical exam.       Return in about 1 year (around 02/19/2025) for CPE.     Shirlee Latch, MD  [facility] [phone] (phone) [phone] (fax)  [facility]

## 2024-02-20 NOTE — Patient Instructions (Signed)
 Call [facility] to schedule a mammogram [phone]

## 2024-02-21 ENCOUNTER — Other Ambulatory Visit: Payer: Self-pay | Admitting: Family Medicine

## 2024-02-21 DIAGNOSIS — Z1231 Encounter for screening mammogram for malignant neoplasm of breast: Secondary | ICD-10-CM

## 2024-02-24 ENCOUNTER — Ambulatory Visit
Admission: RE | Admit: 2024-02-24 | Discharge: 2024-02-24 | Disposition: A | Discharge status: Home / Self Care | Source: Ambulatory Visit

## 2024-02-24 DIAGNOSIS — Z1231 Encounter for screening mammogram for malignant neoplasm of breast: Secondary | ICD-10-CM | POA: Diagnosis not present

## 2024-02-27 ENCOUNTER — Encounter: Payer: Self-pay | Admitting: Family Medicine

## 2024-03-19 DIAGNOSIS — R7309 Other abnormal glucose: Secondary | ICD-10-CM | POA: Diagnosis not present

## 2024-03-25 DIAGNOSIS — E669 Obesity, unspecified: Secondary | ICD-10-CM | POA: Diagnosis not present

## 2024-03-25 DIAGNOSIS — Z0000 Encounter for general adult medical examination without abnormal findings: Secondary | ICD-10-CM | POA: Diagnosis not present

## 2024-03-25 DIAGNOSIS — R7989 Other specified abnormal findings of blood chemistry: Secondary | ICD-10-CM | POA: Diagnosis not present

## 2024-03-26 ENCOUNTER — Encounter: Payer: Self-pay | Admitting: Family Medicine

## 2024-03-26 LAB — COMPREHENSIVE METABOLIC PANEL WITH GFR
ALT: 13 IU/L (ref 0–32)
AST: 16 IU/L (ref 0–40)
Albumin: 4.5 g/dL (ref 3.9–4.9)
Alkaline Phosphatase: 79 IU/L (ref 44–121)
BUN/Creatinine Ratio: 14 (ref 9–23)
BUN: 11 mg/dL (ref 6–24)
Bilirubin Total: 0.6 mg/dL (ref 0.0–1.2)
CO2: 22 mmol/L (ref 20–29)
Calcium: 9.5 mg/dL (ref 8.7–10.2)
Chloride: 101 mmol/L (ref 96–106)
Creatinine, Ser: 0.77 mg/dL (ref 0.57–1.00)
Globulin, Total: 2.7 g/dL (ref 1.5–4.5)
Glucose: 91 mg/dL (ref 70–99)
Potassium: 4.4 mmol/L (ref 3.5–5.2)
Sodium: 138 mmol/L (ref 134–144)
Total Protein: 7.2 g/dL (ref 6.0–8.5)
eGFR: 97 mL/min/{1.73_m2} (ref >59)

## 2024-03-26 LAB — LIPID PANEL WITH LDL/HDL RATIO
Cholesterol, Total: 140 mg/dL (ref 100–199)
HDL: 42 mg/dL (ref >39)
LDL Chol Calc (NIH): 82 mg/dL (ref 0–99)
LDL/HDL Ratio: 2 ratio (ref 0.0–3.2)
Triglycerides: 81 mg/dL (ref 0–149)
VLDL Cholesterol Cal: 16 mg/dL (ref 5–40)

## 2024-03-26 LAB — TSH: TSH: 2.41 u[IU]/mL (ref 0.450–4.500)

## 2024-04-19 DIAGNOSIS — R7309 Other abnormal glucose: Secondary | ICD-10-CM | POA: Diagnosis not present

## 2024-05-19 DIAGNOSIS — R7309 Other abnormal glucose: Secondary | ICD-10-CM | POA: Diagnosis not present

## 2024-06-15 DIAGNOSIS — Z86018 Personal history of other benign neoplasm: Secondary | ICD-10-CM | POA: Diagnosis not present

## 2024-06-15 DIAGNOSIS — L578 Other skin changes due to chronic exposure to nonionizing radiation: Secondary | ICD-10-CM | POA: Diagnosis not present

## 2024-06-15 DIAGNOSIS — Z872 Personal history of diseases of the skin and subcutaneous tissue: Secondary | ICD-10-CM | POA: Diagnosis not present

## 2024-06-19 DIAGNOSIS — R7309 Other abnormal glucose: Secondary | ICD-10-CM | POA: Diagnosis not present

## 2024-07-20 DIAGNOSIS — R7309 Other abnormal glucose: Secondary | ICD-10-CM | POA: Diagnosis not present

## 2024-08-19 DIAGNOSIS — R7309 Other abnormal glucose: Secondary | ICD-10-CM | POA: Diagnosis not present

## 2024-08-31 DIAGNOSIS — M546 Pain in thoracic spine: Secondary | ICD-10-CM | POA: Diagnosis not present

## 2024-08-31 DIAGNOSIS — M5416 Radiculopathy, lumbar region: Secondary | ICD-10-CM | POA: Diagnosis not present

## 2024-08-31 DIAGNOSIS — M9902 Segmental and somatic dysfunction of thoracic region: Secondary | ICD-10-CM | POA: Diagnosis not present

## 2024-09-19 DIAGNOSIS — R7309 Other abnormal glucose: Secondary | ICD-10-CM | POA: Diagnosis not present

## 2025-02-22 ENCOUNTER — Encounter: Admitting: Family Medicine

## 2025-03-02 ENCOUNTER — Encounter: Admitting: Family Medicine
